# Patient Record
Sex: Male | Born: 1990 | Race: White | Hispanic: No | Marital: Single | Smoking: Current some day smoker
Health system: Southern US, Community
[De-identification: ages and names within clinical notes are randomized; demographics above are authoritative.]

## PROBLEM LIST (undated history)

## (undated) DIAGNOSIS — S42292A Other displaced fracture of upper end of left humerus, initial encounter for closed fracture: Secondary | ICD-10-CM

## (undated) DIAGNOSIS — F909 Attention-deficit hyperactivity disorder, unspecified type: Secondary | ICD-10-CM

## (undated) DIAGNOSIS — F32A Depression, unspecified: Secondary | ICD-10-CM

## (undated) DIAGNOSIS — F329 Major depressive disorder, single episode, unspecified: Secondary | ICD-10-CM

## (undated) DIAGNOSIS — S43005A Unspecified dislocation of left shoulder joint, initial encounter: Secondary | ICD-10-CM

---
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
## 2014-08-19 LAB — CBC
HCT: 52 % (ref 40.0–52.0)
HGB: 17.2 g/dL (ref 13.0–18.0)
MCH: 32.4 pg (ref 26.0–34.0)
MCHC: 33.1 g/dL (ref 32.0–36.0)
MCV: 98 fL (ref 80–100)
PLATELETS: 247 10*3/uL (ref 150–440)
RBC: 5.32 10*6/uL (ref 4.40–5.90)
RDW: 13.7 % (ref 11.5–14.5)
WBC: 9.1 10*3/uL (ref 3.8–10.6)

## 2014-08-19 LAB — COMPREHENSIVE METABOLIC PANEL
AST: 31 U/L (ref 15–37)
Albumin: 5 g/dL (ref 3.4–5.0)
Alkaline Phosphatase: 83 U/L
Anion Gap: 9 (ref 7–16)
BUN: 16 mg/dL (ref 7–18)
Bilirubin,Total: 0.7 mg/dL (ref 0.2–1.0)
CHLORIDE: 100 mmol/L (ref 98–107)
CO2: 28 mmol/L (ref 21–32)
CREATININE: 0.95 mg/dL (ref 0.60–1.30)
Calcium, Total: 9.5 mg/dL (ref 8.5–10.1)
Glucose: 96 mg/dL (ref 65–99)
OSMOLALITY: 275 (ref 275–301)
POTASSIUM: 4.3 mmol/L (ref 3.5–5.1)
SGPT (ALT): 41 U/L
Sodium: 137 mmol/L (ref 136–145)
TOTAL PROTEIN: 8.9 g/dL — AB (ref 6.4–8.2)

## 2014-08-19 LAB — DRUG SCREEN, URINE
AMPHETAMINES, UR SCREEN: NEGATIVE (ref ?–1000)
Barbiturates, Ur Screen: NEGATIVE (ref ?–200)
Benzodiazepine, Ur Scrn: POSITIVE (ref ?–200)
COCAINE METABOLITE, UR ~~LOC~~: NEGATIVE (ref ?–300)
Cannabinoid 50 Ng, Ur ~~LOC~~: POSITIVE (ref ?–50)
MDMA (ECSTASY) UR SCREEN: NEGATIVE (ref ?–500)
Methadone, Ur Screen: NEGATIVE (ref ?–300)
Opiate, Ur Screen: NEGATIVE (ref ?–300)
Phencyclidine (PCP) Ur S: NEGATIVE (ref ?–25)
Tricyclic, Ur Screen: NEGATIVE (ref ?–1000)

## 2014-08-19 LAB — URINALYSIS, COMPLETE
BLOOD: NEGATIVE
Bacteria: NONE SEEN
Bilirubin,UR: NEGATIVE
GLUCOSE, UR: NEGATIVE mg/dL (ref 0–75)
LEUKOCYTE ESTERASE: NEGATIVE
NITRITE: NEGATIVE
Ph: 5 (ref 4.5–8.0)
RBC,UR: NONE SEEN /HPF (ref 0–5)
SPECIFIC GRAVITY: 1.025 (ref 1.003–1.030)
SQUAMOUS EPITHELIAL: NONE SEEN

## 2014-08-19 LAB — SALICYLATE LEVEL: Salicylates, Serum: 2.6 mg/dL

## 2014-08-19 LAB — ETHANOL: Ethanol: <3 mg/dL

## 2014-08-19 LAB — ACETAMINOPHEN LEVEL

## 2014-08-20 ENCOUNTER — Inpatient Hospital Stay: Payer: Self-pay | Admitting: Psychiatry

## 2015-02-05 NOTE — H&P (Signed)
PATIENT NAME:  George [name], George [name] MR#:  [id number] DATE OF BIRTH:  Mar 18, 1991  DATE OF ADMISSION:  08/20/2014  REASON FOR ADMISSION: Patient is a 24 year old man who presented to the emergency room in need of detoxification.   CHIEF COMPLAINT: "I need detox."  HISTORY OF PRESENT ILLNESS: Information obtained from the patient and the chart/consult note. Patient states that he has been very depressed recently and is upset about his drinking and that he has been drinking so heavily. He started drinking at age 83 but it has escalated and he currently drinks about 3 or 4, 40-ounce bottles of beer every day. He denies that he is abusing any other drugs. Mood stays down and depressed. Sleep is erratic. Denies having any hallucinations. In the last week he has had serious thoughts of suicide but did not do anything to actually harm himself. He had a recent breakup with a girlfriend and at the time was intoxicated and had made an attempt to hang himself from the basketball goal by his shirt. He also lost his job in September. These things are all motivating him to stop drinking.  PAST PSYCHIATRIC HISTORY: Patient is currently taking Lexapro 10 mg a day and Strattera 60 mg a day. He has no past history of psychiatric hospitalization. He has only 1 mentioned history of suicide attempt as listed above. Has, also, a distant history of cutting.  SUBSTANCE ABUSE HISTORY: As mentioned, he has been drinking since he was 14. No known history of seizures or DTs. Gets very tremulous and sick when he tries to stop. Has tried to stop outpatient using Librium prescribed by a doctor, but had not been able to stop and instead drinks while taking the librium. Today, patient stated that he feels 1 week of librium is not enough.  SOCIAL HISTORY: Patient is currently not working. Lives with his parents. Has no children of his own.  PAST MEDICAL HISTORY: No significant ongoing medical problems.  CURRENT MEDICATIONS: Lexapro 10  mg a day, Strattera 60 mg a day.  ALLERGIES: No known drug allergies.  REVIEW OF SYSTEMS: Depressed mood, shakiness, negative feelings about himself, low self-esteem. No hallucinations. Mild nausea. Denies any other physical problems. The rest of review of systems is unremarkable.  MENTAL STATUS EXAMINATION: Polite, adequately groomed young man. Looks his stated age. Pleasant and cooperative during the interview. Eye contact good. Psychomotor activity marked by some restlessness and tremor. Speech is normal in rate, tone, and volume. Affect is n nervous. Mood is stated as being nervous or anxious. Thoughts are lucid and clear, with no loosening of association. No signs of delusions. Denies auditory or visual hallucination. Admits to passive suicidal thoughts without plan. No homicidal ideation. Alert nad oriented x 4. He can remember 3 of 3 objects immediately. He is showing adequate judgment and insight at the time; normal intelligence.  FAMILY HISTORY: Alcoholism in multiple members of the family.  LABORATORY RESULTS: Reviewed as listed in chart.  VITAL SIGNS: Updated and reviewed as listed in chart.  ASSESSMENT: This is a 24 year old male with alcohol dependence nad depression, suicide attempted while intoxicated last week, unable to stop using alcohol with outpatient detoxification, tremulous and subjective withdrawal symptoms. Meets criteria for admission.  TREATMENT PLAN: Admit to inpatient psychiatry. Seizure and suicide precautions. Detox orders in place. Will continue. Continue the Lexapro and Strattera. Engaged in groups on the unit and encouraged alcohol cessation.   ____________________________ Kaliann Coryell L. Alvester Morin, MD tlb:ST D: 08/21/2014 23:40:00 ET T: 08/22/2014 00:40:37 ET  JOB#: [id number]  cc: Miquel Stacks L. Alvester MorinBell, MD, <Dictator> Mashonda Broski Rhae LernerL Doris Mcgilvery MD ELECTRONICALLY SIGNED 09/30/2014 2:15

## 2015-02-05 NOTE — Discharge Summary (Signed)
PATIENT NAME:  George [name], George [name] MR#:  [id number] DATE OF BIRTH:  1990-10-24  DATE OF ADMISSION:  08/20/2014 DATE OF DISCHARGE:  08/24/2014  IDENTIFYING INFORMATION: [name] 24 year old Caucasian male, single, presented for alcohol detoxification.   CHIEF COMPLAINT: " I need detox."   DIAGNOSES: Alcohol use disorder, severe. Alcohol withdrawal. Major depressive disorder. Cannabis use disorder, severe. Attention deficit hyperactivity disorder.  HOSPITAL COURSE: The patient presented to the Emergency Department on November 6 after he became intoxicated and made [name] suicidal attempt by hanging from the basketball goal by his shirt. The patient stated that the suicidal attempt was triggered by being intoxicated and also by losing his job at [employer] and losing his girlfriend due to his drinking. The patient was admitted to [facility] for stabilization. He was started on [name] benzodiazepine taper. To prevent medical complications from alcohol withdrawal, he was also started on thiamine and multivitamins. For depressive symptoms, the patient was treated as an outpatient with Lexapro, this medication was continued. The patient also has [name] history of ADHD for which he has been prescribed with Strattera since middle school. The patient was continued on Strattera 60 mg, which is his outpatient dose. The patient did not have any complications from the alcohol detoxification. He tolerated it well, the benzodiazepine taper. He did report [name] strong family history of alcoholism on his mother's side. The patient reported having multiple relatives that had drank themselves to death and abused alcohol on [name]  daily basis. Therefore, it is possible that the patient may respond to treatment with naltrexone; he was started on 50 mg p.o. daily.   The treatment plan recommended to the patient to attend rehabilitation. The patient does have private insurance and the social worker looked into [name] referral for [facility]. The  patient completed the assessment with [facility] by phone; however, after the assessment was completed, the patient stated that he did not feel that rehabilitation was what he needed to do. He felt ready to be discharged and continue his treatment outpatient. The patient stated that some of the reasons why he did not want to attend rehabilitation is because of the financial situation his family is on, and that they did not have enough money for the co-pay. [facility] was willing to work with the patient and even decrease the co-pay to $200 [name] day, but the patient once again declined stating that the family was not able to help him financially with the co-pay. On the day of the discharge, the patient denied any suicidality, homicidality or psychosis. He denied problems with sleep, appetite, energy or concentration. He denied having any physical complaints and denied having any side effects from his medications.   MENTAL STATUS EXAMINATION ON THE DAY OF THE DISCHARGE: The patient was alert and oriented in person, place, time and situation. The patient is [name] 24 year old Caucasian male who appears his stated age. He displays good hygiene and grooming. Behavior: He was calm, pleasant, cooperative. Psychomotor activity was within normal range at contact was within normal range. Speech had regular tone, volume and rate. Thought process is linear and goal directed. Thought content was negative for suicidality or homicidality. Perception was negative for psychosis. Mood was euthymic. Affect reactive. Insight and judgment are fair. Cognitive examination: Alert and oriented in person, place, time and situation. Fund of knowledge appears to be average; however, it was not formally tested. Attention and concentration appear to be grossly intact; however, it was not formally tested.   LABORATORY RESULTS: BUN 16,  creatinine 0.95, sodium 137, potassium 4.3, calcium 9.5. Alcohol level at arrival was less than 3. AST 31, ALT 41. Urine  toxicology screen was positive for benzodiazepines and positive for cannabis, as the patient was taking Librium prescribed by a PA that he was seeing on the outpatient basis and the patient did report using cannabis occasionally. WBC 9.1, hemoglobin 17.2, hematocrit 52.0, platelet count 247,000. UA was clear. Acetaminophen level and salicylate level were below detection limit.   DISCHARGE FOLLOWUP: The patient will return to his house with his parents where he lives with his parents. For followup, the patient has been referred to the [facility] in [place], [place]. He has an appointment there at 5:00 p.m. on November 12, telephone number is [phone], fax number is [phone].   DISCHARGE MEDICATIONS: Lexapro 20 mg p.o. daily for depression, Strattera 60 mg p.o. daily for ADHD and naltrexone 50 mg p.o. daily for alcohol dependence.    ____________________________ Jimmy Footman, MD ahg:TT D: 08/24/2014 17:10:28 ET T: 08/24/2014 19:57:00 ET JOB#: [id number]  cc: Jimmy Footman, MD, <Dictator> Horton Chin MD ELECTRONICALLY SIGNED 08/25/2014 14:45

## 2015-02-05 NOTE — Consult Note (Signed)
PATIENT NAME:  George [name], George [name] MR#:  [id number] DATE OF BIRTH:  05/20/1991  DATE OF CONSULTATION:  08/20/2014  REFERRING PHYSICIAN:   CONSULTING PHYSICIAN:  Audery AmelJohn T. Clapacs, MD  IDENTIFYING INFORMATION AND REASON FOR CONSULTATION: [name] 24 year old man presented to the Emergency Room.   CHIEF COMPLAINT: "I need detox."   HISTORY OF PRESENT ILLNESS: Information obtained from the patient and the chart. The patient states that he has been very depressed recently, upset about his drinking and has been drinking heavily. He started drinking at age 24, but it has escalated and he currently drinks about 3 or 4 forty-ounce bottles of beer every day. He denies that he is abusing any other drugs. His mood stays down and depressed. His sleep is erratic. He is not having any hallucinations. In the last week, he had some serious thoughts of suicide, but did not actually do anything to harm himself. He had [name] recent break-up with [name] girlfriend and at the time he was intoxicated, and had made an attempt to hang himself from basketball goal by his shirt. He also lost his job in September. These are all motivating him to stop.   PAST PSYCHIATRIC HISTORY: The patient is currently taking Lexapro 10 mg [name] day and Strattera 60 mg [name] day. He has no past history of psychiatric hospitalization. He has only the one mentioned history of suicide attempts, but he has been more distant history of cutting.   SUBSTANCE ABUSE HISTORY: As mentioned, he has been drinking since he was 14. No known history of seizures or DTs. Gets very tremulous and sick when he tries to stop. Has tried to stop outpatient using Librium prescribed by [name] doctor, but has never been able to stop, and instead just drinks while taking the Librium.   SOCIAL HISTORY: The patient is currently not working. He lives with his parents. Has no children of his own.   PAST MEDICAL HISTORY: No significant ongoing medical problems.   CURRENT MEDICATIONS: Lexapro 10 mg [name]  day, Strattera 60 mg [name] day.   ALLERGIES: No known drug allergies.   REVIEW OF SYSTEMS: Depressed mood. Shakiness. Mild nausea. Negative feelings about himself. No hallucinations. The rest of the physical review of systems is unremarkable.   MENTAL STATUS EXAMINATION: Adequately groomed young man who looks his stated age, cooperative and pleasant during the interview. Eye contact good. Psychomotor activity marked by [name] mild rest and action tremor. Speech is normal in rate, tone and volume. Affect is nervous and jittery. Mood stated as being nervous. Thoughts are lucid without loosening of associations. No sign of delusions. Denies auditory or visual hallucinations. Passive suicidal thoughts without plan. No homicidal ideation. Alert and oriented x4. He can remember 3/3 objects immediately and 2/3 at 3 minutes. He is showing adequate judgment and insight at the moment. Normal intelligence.   FAMILY HISTORY: Family history of alcoholism in multiple members of the family.   LABORATORY RESULTS: Chemistry panel: Elevated total protein 8.9, otherwise unremarkable. Drug screen is positive for benzodiazepines and cannabis; I am not sure whether that was done after he was given medication. The alcohol level was undetectable. CBC normal. Urinalysis: Positive protein. No infection. Acetaminophen and salicylates negative.   VITAL SIGNS: In the Emergency Room: Blood pressure 127/77, respirations 18, pulse 94, temperature 98.2.   ASSESSMENT: [name] 24 year old male with alcohol dependence and depression. Suicide attempt while intoxicated last week. Unable to stop using outpatient detoxification. Tremulous and  subjective withdrawal symptoms. Meets criteria for admission to  the hospital for detoxification as well as stabilization of depression.   TREATMENT PLAN: Admit to psychiatry. Seizure and suicide precautions. Detox orders in place. Continue the Lexapro and Strattera. Engage him in groups on the unit and further  treatment can be decided by the primary team.   DIAGNOSIS, PRINCIPAL AND PRIMARY:  AXIS I: Alcohol dependence.   SECONDARY DIAGNOSES: AXIS I: Major depression, single, moderate.  AXIS II: Deferred.  AXIS III: Alcohol withdrawal.   ____________________________ Audery Amel, MD jtc:lm D: 08/20/2014 20:57:53 ET T: 08/20/2014 21:11:06 ET JOB#: [id number]  cc: Audery Amel, MD, <Dictator> Audery Amel MD ELECTRONICALLY SIGNED 08/21/2014 14:59

## 2015-05-17 ENCOUNTER — Emergency Department (HOSPITAL_COMMUNITY): Payer: BLUE CROSS/BLUE SHIELD

## 2015-05-17 ENCOUNTER — Encounter (HOSPITAL_COMMUNITY): Payer: Self-pay | Admitting: Cardiology

## 2015-05-17 ENCOUNTER — Emergency Department (HOSPITAL_COMMUNITY)
Admission: EM | Admit: 2015-05-17 | Discharge: 2015-05-17 | Disposition: A | Discharge status: Home / Self Care | Payer: BLUE CROSS/BLUE SHIELD | Attending: Emergency Medicine | Admitting: Emergency Medicine

## 2015-05-17 DIAGNOSIS — F419 Anxiety disorder, unspecified: Secondary | ICD-10-CM | POA: Insufficient documentation

## 2015-05-17 DIAGNOSIS — Y998 Other external cause status: Secondary | ICD-10-CM | POA: Diagnosis not present

## 2015-05-17 DIAGNOSIS — S43005A Unspecified dislocation of left shoulder joint, initial encounter: Secondary | ICD-10-CM

## 2015-05-17 DIAGNOSIS — Z720 Tobacco use: Secondary | ICD-10-CM | POA: Insufficient documentation

## 2015-05-17 DIAGNOSIS — X58XXXA Exposure to other specified factors, initial encounter: Secondary | ICD-10-CM | POA: Insufficient documentation

## 2015-05-17 DIAGNOSIS — Y9289 Other specified places as the place of occurrence of the external cause: Secondary | ICD-10-CM | POA: Insufficient documentation

## 2015-05-17 DIAGNOSIS — F909 Attention-deficit hyperactivity disorder, unspecified type: Secondary | ICD-10-CM | POA: Diagnosis not present

## 2015-05-17 DIAGNOSIS — S43015A Anterior dislocation of left humerus, initial encounter: Secondary | ICD-10-CM | POA: Diagnosis not present

## 2015-05-17 DIAGNOSIS — Y9389 Activity, other specified: Secondary | ICD-10-CM | POA: Insufficient documentation

## 2015-05-17 DIAGNOSIS — S4992XA Unspecified injury of left shoulder and upper arm, initial encounter: Secondary | ICD-10-CM | POA: Diagnosis present

## 2015-05-17 DIAGNOSIS — Z79899 Other long term (current) drug therapy: Secondary | ICD-10-CM | POA: Diagnosis not present

## 2015-05-17 DIAGNOSIS — F329 Major depressive disorder, single episode, unspecified: Secondary | ICD-10-CM | POA: Diagnosis not present

## 2015-05-17 DIAGNOSIS — S42292A Other displaced fracture of upper end of left humerus, initial encounter for closed fracture: Secondary | ICD-10-CM | POA: Insufficient documentation

## 2015-05-17 HISTORY — DX: Depression, unspecified: F32.A

## 2015-05-17 HISTORY — DX: Major depressive disorder, single episode, unspecified: F32.9

## 2015-05-17 HISTORY — DX: Attention-deficit hyperactivity disorder, unspecified type: F90.9

## 2015-05-17 LAB — BASIC METABOLIC PANEL
Anion gap: 15 (ref 5–15)
BUN: 18 mg/dL (ref 6–20)
CO2: 22 mmol/L (ref 22–32)
Calcium: 11.3 mg/dL — ABNORMAL HIGH (ref 8.9–10.3)
Chloride: 99 mmol/L — ABNORMAL LOW (ref 101–111)
Creatinine, Ser: 1.15 mg/dL (ref 0.61–1.24)
GFR calc Af Amer: >60 mL/min (ref >60)
GFR calc non Af Amer: >60 mL/min (ref >60)
Glucose, Bld: 117 mg/dL — ABNORMAL HIGH (ref 65–99)
Potassium: 3.9 mmol/L (ref 3.5–5.1)
Sodium: 136 mmol/L (ref 135–145)

## 2015-05-17 LAB — CBC
HCT: 47.5 % (ref 39.0–52.0)
Hemoglobin: 16.7 g/dL (ref 13.0–17.0)
MCH: 32.7 pg (ref 26.0–34.0)
MCHC: 35.2 g/dL (ref 30.0–36.0)
MCV: 93.1 fL (ref 78.0–100.0)
Platelets: 276 10*3/uL (ref 150–400)
RBC: 5.1 MIL/uL (ref 4.22–5.81)
RDW: 12.9 % (ref 11.5–15.5)
WBC: 25.4 10*3/uL — ABNORMAL HIGH (ref 4.0–10.5)

## 2015-05-17 MED ORDER — HYDROCODONE-ACETAMINOPHEN 5-325 MG PO TABS: 1.0000 | ORAL_TABLET | ORAL | Status: DC | PRN

## 2015-05-17 MED ORDER — HYDROMORPHONE HCL 1 MG/ML IJ SOLN
1.0000 mg | Freq: Once | INTRAMUSCULAR | Status: AC
  Administered 2015-05-17: 1 mg via INTRAVENOUS
  Filled 2015-05-17: qty 1

## 2015-05-17 MED ORDER — PROPOFOL 10 MG/ML IV BOLUS
INTRAVENOUS | Status: AC
  Administered 2015-05-17: 60 mg via INTRAVENOUS
  Filled 2015-05-17: qty 20

## 2015-05-17 MED ORDER — MORPHINE SULFATE 4 MG/ML IJ SOLN
6.0000 mg | Freq: Once | INTRAMUSCULAR | Status: AC
  Administered 2015-05-17: 6 mg via INTRAVENOUS
  Filled 2015-05-17: qty 2

## 2015-05-17 MED ORDER — SODIUM CHLORIDE 0.9 % IV BOLUS (SEPSIS)
1000.0000 mL | Freq: Once | INTRAVENOUS | Status: AC
Start: 2015-05-17 — End: 2015-05-17
  Administered 2015-05-17: 1000 mL via INTRAVENOUS

## 2015-05-17 MED ORDER — PROPOFOL 10 MG/ML IV BOLUS
60.0000 mg | Freq: Once | INTRAVENOUS | Status: AC
  Administered 2015-05-17 (×2): 60 mg via INTRAVENOUS

## 2015-05-17 NOTE — Discharge Instructions (Signed)
If you were given medicines take as directed.  If you are on coumadin or contraceptives realize their levels and effectiveness is altered by many different medicines.  If you have any reaction (rash, tongues swelling, other) to the medicines stop taking and see a physician.   No weight bearing, wear sling. If your blood pressure was elevated in the ER make sure you follow up for management with a primary doctor or return for chest pain, shortness of breath or stroke symptoms. For severe pain take norco or vicodin however realize they have the potential for addiction and it can make you sleepy and has tylenol in it.  No operating machinery while taking.  Please follow up as directed and return to the ER or see a physician for new or worsening symptoms.  Thank you. Filed Vitals:   05/17/15 1500 05/17/15 1530 05/17/15 1612 05/17/15 1616  BP: 141/94 132/108 142/111 151/97  Pulse: 120 121 127 122  Resp: Height:      Weight:      SpO2: 97% 98% 99% 100%

## 2015-05-17 NOTE — ED Provider Notes (Addendum)
CSN: [id number]     Arrival date & time 05/17/15  1346 History   First MD Initiated Contact with Patient 05/17/15 1355     Chief Complaint  Patient presents with  . Shoulder Injury     (Consider location/radiation/quality/duration/timing/severity/associated sxs/prior Treatment) HPI Comments: 24 year old male with history of depression ADHD presents with bilateral shoulder pain worse the left side. Patient was in court today and does not remember details except he recalls being anxious and frustrated about having to give a urine drug screen. The next thing he knew he was surrounded by officers in handcuffs. He is unsure if he passed out. No cardiac history. Patient has tenderness with any range of motion. Patient felt that both the shoulders were dislocated however he felt the right shoulder popping. Patient primarily has pain in the left shoulder with range of motion. Patient had x-rays done showing dislocation fracture at urgent care and sent here. Orthopedics was called prior to arrival to ensure they were on call to assist. No significant meals today.  Patient is a 24 y.o. male presenting with shoulder injury. The history is provided by the patient.  Shoulder Injury Pertinent negatives include no chest pain, no abdominal pain, no headaches and no shortness of breath.    Past Medical History  Diagnosis Date  . Depressed   . ADHD (attention deficit hyperactivity disorder)    History reviewed. No pertinent past surgical history. History reviewed. No pertinent family history. History  Substance Use Topics  . Smoking status: Current Every Day Smoker  . Smokeless tobacco: Not on file  . Alcohol Use: No    Review of Systems  Constitutional: Negative for fever and chills.  HENT: Negative for congestion.   Eyes: Negative for visual disturbance.  Respiratory: Negative for shortness of breath.   Cardiovascular: Negative for chest pain.  Gastrointestinal: Negative for vomiting and  abdominal pain.  Genitourinary: Negative for dysuria and flank pain.  Musculoskeletal: Positive for arthralgias. Negative for back pain, neck pain and neck stiffness.  Skin: Negative for rash.  Neurological: Negative for light-headedness and headaches.      Allergies  Review of patient's allergies indicates no known allergies.  Home Medications   Prior to Admission medications   Medication Sig Start Date End Date Taking? Authorizing Provider  escitalopram (LEXAPRO) 20 MG tablet Take 20 mg by mouth daily. 04/14/15  Yes Historical Provider, MD  ibuprofen (ADVIL,MOTRIN) 200 MG tablet Take 200 mg by mouth every 6 (six) hours as needed for mild pain or moderate pain.   Yes Historical Provider, MD  STRATTERA 80 MG capsule Take 80 mg by mouth daily. 04/27/15  Yes Historical Provider, MD  HYDROcodone-acetaminophen (NORCO) 5-325 MG per tablet Take 1-2 tablets by mouth every 4 (four) hours as needed. 05/17/15   Blane Ohara, MD   BP 130/102 mmHg  Pulse 107  Temp(Src) 98.1 F (36.7 C) (Oral)  Resp 16  Ht 6' (1.829 m)  Wt 220 lb (99.791 kg)  BMI 29.83 kg/m2  SpO2 100% Physical Exam  Constitutional: He is oriented to person, place, and time. He appears well-developed and well-nourished.  HENT:  Head: Normocephalic and atraumatic.  Eyes: Right eye exhibits no discharge. Left eye exhibits no discharge.  Neck: Normal range of motion. Neck supple. No tracheal deviation present.  Cardiovascular: Normal rate and regular rhythm.   Pulmonary/Chest: Effort normal and breath sounds normal.  Abdominal: Soft. He exhibits no distension. There is no tenderness. There is no guarding.  Musculoskeletal: He exhibits tenderness.  He exhibits no edema.  Patient has moderate tenderness to palpation anterior lateral shoulder joint on the left. No significant effusion. Deformity and protrusion anterior mild. Neurovascularly intact distal left arm, pain with any range of motion held internal rotation. Patient has  full range of motion right shoulder with minimal discomfort.  Neurological: He is alert and oriented to person, place, and time. No cranial nerve deficit.  Skin: Skin is warm. No rash noted.  Psychiatric: He has a normal mood and affect.  Nursing note and vitals reviewed.   ED Course  Reduction of dislocation Date/Time: 05/17/2015 4:18 PM Performed by: Blane Ohara Authorized by: Blane Ohara Consent: Verbal consent obtained. Written consent obtained. Consent given by: patient Patient identity confirmed: verbally with patient and arm band Time out: Immediately prior to procedure a "time out" was called to verify the correct patient, procedure, equipment, support staff and site/side marked as required. Patient sedated: yes Sedatives: propofol Vitals: Vital signs were monitored during sedation. Patient tolerance: Patient tolerated the procedure well with no immediate complications   (including critical care time) Procedural sedation Performed by: Enid Skeens Consent: Verbal consent obtained. Risks and benefits: risks, benefits and alternatives were discussed Required items: required blood products, implants, devices, and special equipment available Patient identity confirmed: arm band and provided demographic data Time out: Immediately prior to procedure a "time out" was called to verify the correct patient, procedure, equipment, support staff and site  Sedation type: moderate (conscious) sedation NPO time confirmed, risks discussed  Sedatives: propofol  Physician Time at Bedside: 15 min  Vitals: Vital signs were monitored during sedation. Cardiac Monitor, pulse oximeter Patient tolerance: Patient tolerated the procedure well with no immediate complications. Comments: Pt with uneventful recovered. Returned to pre-procedural sedation baseline    Labs Review Labs Reviewed  CBC - Abnormal; Notable for the following:    WBC 25.4 (*)    All other components within  normal limits  BASIC METABOLIC PANEL - Abnormal; Notable for the following:    Chloride 99 (*)    Glucose, Bld 117 (*)    Calcium 11.3 (*)    All other components within normal limits    Imaging Review Dg Shoulder Right  05/17/2015   CLINICAL DATA:  Syncopal episode today. Fall. Right shoulder pain. Dislocation.  EXAM: RIGHT SHOULDER - 2+ VIEW  COMPARISON:  None.  FINDINGS: Right shoulder is located. No acute bone or soft tissue abnormalities are present. The visualized hemi thorax is clear. The right clavicle is intact.  IMPRESSION: Negative right shoulder radiographs.   Electronically Signed   By: Marin Roberts M.D.   On: 05/17/2015 15:01   Dg Shoulder Left  05/17/2015   CLINICAL DATA:  Pain following fall  EXAM: LEFT SHOULDER - 2+ VIEW  COMPARISON:  None.  FINDINGS: Frontal, Y scapular, and attempted axillary images were obtained. There is a subcoracoid anterior dislocation. There is a comminuted fracture involving the greater tuberosity with multiple displaced fracture fragments in this area. The greater tuberosity is displaced lateral to the remainder of the humerus. No other fracture. No erosive change.  IMPRESSION: Subcoracoid anterior dislocation. Comminuted fracture along the proximal left humerus with the greater tuberosity avulsed and fragmented.   Electronically Signed   By: Bretta Bang III M.D.   On: 05/17/2015 15:01   Dg Shoulder Left Port  05/17/2015   CLINICAL DATA:  Post reduction left shoulder  EXAM: LEFT SHOULDER - 1 VIEW  COMPARISON:  Left shoulder earlier today  FINDINGS: Satisfactory  reduction.  Avulsion fracture of the greater tuberosity with improved alignment. AC joint intact.  IMPRESSION: Satisfactory reduction of left shoulder. Improved alignment of greater tuberosity fracture.   Electronically Signed   By: Marlan Palau M.D.   On: 05/17/2015 16:40     EKG Interpretation None     EKG reviewed, HR 116 sinus tach, nl QT, no other acute findings.  MDM    Final diagnoses:  Shoulder dislocation, left, initial encounter  Humeral head fracture, left, closed, initial encounter   Patient presents with left shoulder pain and possible syncope/seizure difficult as he does not recall the events and no one at bedside.  X-ray reviewed consistent with dislocation and fracture of humeral head on the left. Orthopedics consult at. Pain medicines ordered and basic blood work. EKG reviewed sinus tachycardia, no delta waves normal PR. Pain meds given.  Repaged ortho. Dr Romeo Apple rec to reduce and if unable to call him back.  Discussed r/b with pt, time out, shoulder reduced one attempt, repeat xray.  Fup in office.    The patients results and plan were reviewed and discussed.   Any x-rays performed were independently reviewed by myself.   Differential diagnosis were considered with the presenting HPI.  Medications  sodium chloride 0.9 % bolus 1,000 mL (0 mLs Intravenous Stopped 05/17/15 1526)  morphine 4 MG/ML injection 6 mg (6 mg Intravenous Given 05/17/15 1428)  HYDROmorphone (DILAUDID) injection 1 mg (1 mg Intravenous Given 05/17/15 1530)  propofol (DIPRIVAN) 10 mg/mL bolus/IV push 60 mg (0 mg Intravenous Stopped 05/17/15 1648)    Filed Vitals:   05/17/15 1621 05/17/15 1625 05/17/15 1625 05/17/15 1743  BP: 132/98 130/94 130/94 130/102  Pulse: 123 113 112 107  Temp:    98.1 F (36.7 C)  TempSrc:    Oral  Resp: 27 25 20 16   Height:      Weight:      SpO2: 99% 99% 96% 100%    Final diagnoses:  Shoulder dislocation, left, initial encounter  Humeral head fracture, left, closed, initial encounter       Blane Ohara, MD 05/17/15 1542  Blane Ohara, MD 05/18/15 [phone]

## 2015-05-17 NOTE — ED Notes (Signed)
Detained by an officer this morning and injured left shoulder.  Went to urgent care and shoulder broken and dislocated.

## 2015-05-19 ENCOUNTER — Encounter (HOSPITAL_COMMUNITY): Payer: Self-pay | Admitting: Emergency Medicine

## 2015-05-19 ENCOUNTER — Emergency Department (HOSPITAL_COMMUNITY)
Admission: EM | Admit: 2015-05-19 | Discharge: 2015-05-19 | Disposition: A | Discharge status: Home / Self Care | Payer: BLUE CROSS/BLUE SHIELD | Attending: Emergency Medicine | Admitting: Emergency Medicine

## 2015-05-19 ENCOUNTER — Ambulatory Visit: Payer: BLUE CROSS/BLUE SHIELD | Admitting: Orthopedic Surgery

## 2015-05-19 DIAGNOSIS — M79661 Pain in right lower leg: Secondary | ICD-10-CM | POA: Diagnosis not present

## 2015-05-19 DIAGNOSIS — M2550 Pain in unspecified joint: Secondary | ICD-10-CM

## 2015-05-19 DIAGNOSIS — Z79899 Other long term (current) drug therapy: Secondary | ICD-10-CM | POA: Diagnosis not present

## 2015-05-19 DIAGNOSIS — M549 Dorsalgia, unspecified: Secondary | ICD-10-CM | POA: Diagnosis not present

## 2015-05-19 DIAGNOSIS — S42302D Unspecified fracture of shaft of humerus, left arm, subsequent encounter for fracture with routine healing: Secondary | ICD-10-CM | POA: Diagnosis not present

## 2015-05-19 DIAGNOSIS — M25512 Pain in left shoulder: Secondary | ICD-10-CM | POA: Diagnosis present

## 2015-05-19 DIAGNOSIS — F909 Attention-deficit hyperactivity disorder, unspecified type: Secondary | ICD-10-CM | POA: Insufficient documentation

## 2015-05-19 DIAGNOSIS — M25511 Pain in right shoulder: Secondary | ICD-10-CM | POA: Diagnosis not present

## 2015-05-19 DIAGNOSIS — J029 Acute pharyngitis, unspecified: Secondary | ICD-10-CM | POA: Diagnosis not present

## 2015-05-19 DIAGNOSIS — W1839XD Other fall on same level, subsequent encounter: Secondary | ICD-10-CM | POA: Diagnosis not present

## 2015-05-19 DIAGNOSIS — M79662 Pain in left lower leg: Secondary | ICD-10-CM | POA: Diagnosis not present

## 2015-05-19 DIAGNOSIS — F329 Major depressive disorder, single episode, unspecified: Secondary | ICD-10-CM | POA: Diagnosis not present

## 2015-05-19 DIAGNOSIS — Z720 Tobacco use: Secondary | ICD-10-CM | POA: Insufficient documentation

## 2015-05-19 DIAGNOSIS — R112 Nausea with vomiting, unspecified: Secondary | ICD-10-CM | POA: Diagnosis not present

## 2015-05-19 HISTORY — DX: Unspecified dislocation of left shoulder joint, initial encounter: S43.005A

## 2015-05-19 HISTORY — DX: Other displaced fracture of upper end of left humerus, initial encounter for closed fracture: S42.292A

## 2015-05-19 MED ORDER — HYDROCODONE-ACETAMINOPHEN 5-325 MG PO TABS: 1.0000 | ORAL_TABLET | Freq: Four times a day (QID) | ORAL | Status: DC | PRN

## 2015-05-19 MED ORDER — ONDANSETRON HCL 4 MG/2ML IJ SOLN: 4.0000 mg | Freq: Once | INTRAMUSCULAR | Status: DC

## 2015-05-19 MED ORDER — HYDROMORPHONE HCL 1 MG/ML IJ SOLN
1.0000 mg | Freq: Once | INTRAMUSCULAR | Status: AC
  Administered 2015-05-19: 1 mg via INTRAVENOUS
  Filled 2015-05-19: qty 1

## 2015-05-19 MED ORDER — SODIUM CHLORIDE 0.9 % IV SOLN
INTRAVENOUS | Status: DC
  Administered 2015-05-19: 12:00:00 via INTRAVENOUS

## 2015-05-19 MED ORDER — ONDANSETRON HCL 4 MG/2ML IJ SOLN
4.0000 mg | Freq: Once | INTRAMUSCULAR | Status: AC
  Administered 2015-05-19: 4 mg via INTRAVENOUS
  Filled 2015-05-19: qty 2

## 2015-05-19 MED ORDER — HYDROMORPHONE HCL 4 MG PO TABS: 4.0000 mg | ORAL_TABLET | Freq: Four times a day (QID) | ORAL | Status: DC | PRN

## 2015-05-19 MED ORDER — KETOROLAC TROMETHAMINE 30 MG/ML IJ SOLN
30.0000 mg | Freq: Once | INTRAMUSCULAR | Status: AC
  Administered 2015-05-19: 30 mg via INTRAVENOUS
  Filled 2015-05-19: qty 1

## 2015-05-19 MED ORDER — SODIUM CHLORIDE 0.9 % IV BOLUS (SEPSIS)
1000.0000 mL | Freq: Once | INTRAVENOUS | Status: AC
  Administered 2015-05-19: 1000 mL via INTRAVENOUS

## 2015-05-19 NOTE — ED Notes (Signed)
Pt given water to drink with MD Zackowski approval.

## 2015-05-19 NOTE — ED Notes (Signed)
Pt instructed to take slow, deep breaths to control rapid RR. Pt verbalized understanding.

## 2015-05-19 NOTE — ED Notes (Signed)
Pt c/o of worsening pain. Pt requesting medication for pain. Reminded patient that MD would have to assess pt before pain medication could be given. Pt verbalized understanding. Pt states nausea has improved since Zofran administration.

## 2015-05-19 NOTE — ED Notes (Addendum)
Pt sent from MD Harrison's office for uncontrolled pain and vomiting. Pt also c/o of worsening pain to LT shoulder and c/o of bilateral knee pain. Pt dry heaving upon assessment. Pt able to ambulate with assistance. Pt had a LT shoulder reduction yesterday. Pt also has a LT humeral head fracture.

## 2015-05-19 NOTE — ED Notes (Signed)
MD Zackowski at bedside. 

## 2015-05-19 NOTE — Discharge Instructions (Signed)
Keep your left arm in a sling make appointment to follow-up with Dr. Romeo Apple. Take pain medications as directed.

## 2015-05-19 NOTE — ED Provider Notes (Signed)
CSN: [id number]     Arrival date & time 05/19/15  0929 History  This chart was scribed for Vanetta Mulders, MD by Andrew Au, ED Scribe. This patient was seen in room APA02/APA02 and the patient's care was started at 11:19 AM.  Chief Complaint  Patient presents with  . Emesis   HPI   HPI Comments:   George [name] is a 24 y.o. male who present to the Emergency Department complaining of uncontrolled pain and emesis. Pt was seen here 2 days ago. He had a dislocation of left shoulder that was reduced and was prescribed hydrocodone. He was referred to ortho, Dr. Romeo Apple, for follow up. Pt went to see Dr. Romeo Apple this morning but states Dr. Romeo Apple would not see him due to him being in distressed. He currently has 9/10 bilateral knee pain, 8/10 left shoulder pain and right shoulder pain with locking of right shoulder that he states began 2 days ago after being seen but worsened today. He also reports emesis, some sore throat, back pain and arthralgia.  Pt has been taking hydrocodone and reports he finished medication this morning. No hx of seizure. He denies fevers, chills, visual changes, cough rhinorrhea, CP, trouble breathing, abdominal pain, diarrhea, dysuria, leg swelling, rash, bleeding easily, and HA.  Past Medical History  Diagnosis Date  . Depressed   . ADHD (attention deficit hyperactivity disorder)   . Dislocation of left shoulder joint   . Fracture of humeral head, left, closed    History reviewed. No pertinent past surgical history. No family history on file. History  Substance Use Topics  . Smoking status: Current Every Day Smoker  . Smokeless tobacco: Not on file  . Alcohol Use: No    Review of Systems  Constitutional: Negative for fever and chills.  HENT: Positive for sore throat. Negative for congestion and rhinorrhea.   Eyes: Negative for visual disturbance.  Respiratory: Negative for apnea, cough and shortness of breath.   Cardiovascular: Negative for chest pain  and leg swelling.  Gastrointestinal: Positive for nausea and vomiting. Negative for abdominal pain and diarrhea.  Genitourinary: Negative for dysuria.  Musculoskeletal: Positive for myalgias, back pain and arthralgias.  Skin: Negative for rash.  Neurological: Negative for headaches.  Hematological: Does not bruise/bleed easily.   Allergies  Review of patient's allergies indicates no known allergies.  Home Medications   Prior to Admission medications   Medication Sig Start Date End Date Taking? Authorizing Provider  escitalopram (LEXAPRO) 20 MG tablet Take 20 mg by mouth daily. 04/14/15  Yes Historical Provider, MD  HYDROcodone-acetaminophen (NORCO) 5-325 MG per tablet Take 1-2 tablets by mouth every 4 (four) hours as needed. 05/17/15  Yes Blane Ohara, MD  ibuprofen (ADVIL,MOTRIN) 200 MG tablet Take 600 mg by mouth every 6 (six) hours as needed for mild pain or moderate pain.    Yes Historical Provider, MD  STRATTERA 80 MG capsule Take 80 mg by mouth daily. 04/27/15  Yes Historical Provider, MD  HYDROcodone-acetaminophen (NORCO/VICODIN) 5-325 MG per tablet Take 1-2 tablets by mouth every 6 (six) hours as needed. 05/19/15   Vanetta Mulders, MD  HYDROmorphone (DILAUDID) 4 MG tablet Take 1 tablet (4 mg total) by mouth every 6 (six) hours as needed for severe pain. 05/19/15   Vanetta Mulders, MD   BP 134/80 mmHg  Pulse 105  Temp(Src) 100.5 F (38.1 C)  Resp 18  Ht 6' (1.829 m)  Wt 220 lb (99.791 kg)  BMI 29.83 kg/m2  SpO2 91% Physical Exam  Constitutional: He is oriented to person, place, and time. He appears well-developed and well-nourished. No distress.  HENT:  Head: Normocephalic and atraumatic.  Eyes: Conjunctivae and EOM are normal.  Neck: Neck supple.  Cardiovascular: Normal rate and regular rhythm.   Pulses:      Radial pulses are 2+ on the right side, and 2+ on the left side.     Pulmonary/Chest: Effort normal and breath sounds normal. He has no wheezes. He has no rales.   Abdominal: Bowel sounds are normal. There is no tenderness.  Musculoskeletal: Normal range of motion.  No swelling in ankle bilaterally.No deformity in left shoulder. No effusion in knee bilaterally . No swelling in knee  Discomfort in right elbow and right shoulder.  Neurological: He is alert and oriented to person, place, and time.  Skin: Skin is warm and dry.  Psychiatric: He has a normal mood and affect. His behavior is normal.  Nursing note and vitals reviewed.  ED Course  Procedures (including critical care time) DIAGNOSTIC STUDIES: Oxygen Saturation is 91% on RA, adequate by my interpretation.    COORDINATION OF CARE: 11:36 AM- Pt advised of plan for treatment and pt agrees.  Labs Review Labs Reviewed - No data to display  Imaging Review Dg Shoulder Right  05/17/2015   CLINICAL DATA:  Syncopal episode today. Fall. Right shoulder pain. Dislocation.  EXAM: RIGHT SHOULDER - 2+ VIEW  COMPARISON:  None.  FINDINGS: Right shoulder is located. No acute bone or soft tissue abnormalities are present. The visualized hemi thorax is clear. The right clavicle is intact.  IMPRESSION: Negative right shoulder radiographs.   Electronically Signed   By: Marin Roberts M.D.   On: 05/17/2015 15:01   Dg Shoulder Left  05/17/2015   CLINICAL DATA:  Pain following fall  EXAM: LEFT SHOULDER - 2+ VIEW  COMPARISON:  None.  FINDINGS: Frontal, Y scapular, and attempted axillary images were obtained. There is a subcoracoid anterior dislocation. There is a comminuted fracture involving the greater tuberosity with multiple displaced fracture fragments in this area. The greater tuberosity is displaced lateral to the remainder of the humerus. No other fracture. No erosive change.  IMPRESSION: Subcoracoid anterior dislocation. Comminuted fracture along the proximal left humerus with the greater tuberosity avulsed and fragmented.   Electronically Signed   By: Bretta Bang III M.D.   On: 05/17/2015 15:01   Dg  Shoulder Left Port  05/17/2015   CLINICAL DATA:  Post reduction left shoulder  EXAM: LEFT SHOULDER - 1 VIEW  COMPARISON:  Left shoulder earlier today  FINDINGS: Satisfactory reduction.  Avulsion fracture of the greater tuberosity with improved alignment. AC joint intact.  IMPRESSION: Satisfactory reduction of left shoulder. Improved alignment of greater tuberosity fracture.   Electronically Signed   By: Marlan Palau M.D.   On: 05/17/2015 16:40     EKG Interpretation None      MDM   Final diagnoses:  Arthralgia  Humerus fracture, left, with routine healing, subsequent encounter    Patient's joint pain to both knees and both shoulders improved significantly with pain medication in the emergency department. No new falls or injuries. Left arms never been out of the sling. Areas were not re-x-rayed. Patient's pain medication modified. Patient will make an another appointment to follow-up with orthopedics. Patient will keep her left arm in sling.  I personally performed the services described in this documentation, which was scribed in my presence. The recorded information has been reviewed and is accurate.  Vanetta Mulders, MD 05/19/15 1343

## 2015-05-23 ENCOUNTER — Ambulatory Visit (INDEPENDENT_AMBULATORY_CARE_PROVIDER_SITE_OTHER): Payer: BLUE CROSS/BLUE SHIELD | Admitting: Orthopedic Surgery

## 2015-05-23 VITALS — BP 146/98 | Ht 72.0 in | Wt 220.0 lb

## 2015-05-23 DIAGNOSIS — S4292XA Fracture of left shoulder girdle, part unspecified, initial encounter for closed fracture: Secondary | ICD-10-CM

## 2015-05-23 MED ORDER — HYDROMORPHONE HCL 4 MG PO TABS: 4.0000 mg | ORAL_TABLET | ORAL | Status: DC | PRN

## 2015-05-23 NOTE — Patient Instructions (Signed)
We will schedule Ct scan and have you follow up in the office afterward

## 2015-05-24 ENCOUNTER — Encounter: Payer: Self-pay | Admitting: Orthopedic Surgery

## 2015-05-24 NOTE — Progress Notes (Signed)
Patient ID: George [name], male   DOB: 09-20-1991, 24 y.o.   MRN: [id number]  NEW  Chief Complaint  Patient presents with  . Shoulder Injury    er follow up left shoulder fx + dislocation , DOI 05/17/15    George [name] is a 24 y.o. male.   HPI 24 year old male [place] has a history of ADHD he sustained a fracture dislocation of his left shoulder documented by x-ray. He also says that his right shoulder also came out of place but spontaneously reduced and required no treatment. He was injured on 05/17/2015 brought to the ER x-ray showed a fracture dislocation of the left shoulder involving the left greater tuberosity and glenohumeral joint. He underwent closed reduction with good glenohumeral joint reduction on x-ray but possible continued displacement of the greater tuberosity which will require further imaging.  He comes in complaining of pain and swelling over the left shoulder 6 days associated with moderate to severe pain. He is on Norco and Dilaudid along with Strattera Motrin and Lexapro  Review of systems he denies any numbness or tingling he says that the right shoulder feels like it's clicking or popping. There is no evidence of erythema over the left shoulder although there is a lot of swelling and ecchymosis Review of Systems See hpi  Past Medical History  Diagnosis Date  . Depressed   . ADHD (attention deficit hyperactivity disorder)   . Dislocation of left shoulder joint   . Fracture of humeral head, left, closed     No Known Allergies  Current Outpatient Prescriptions  Medication Sig Dispense Refill  . escitalopram (LEXAPRO) 20 MG tablet Take 20 mg by mouth daily.  10  . HYDROcodone-acetaminophen (NORCO/VICODIN) 5-325 MG per tablet Take 1-2 tablets by mouth every 6 (six) hours as needed. 20 tablet 0  . HYDROmorphone (DILAUDID) 4 MG tablet Take 1 tablet (4 mg total) by mouth every 6 (six) hours as needed for  severe pain. 20 tablet 0  . ibuprofen (ADVIL,MOTRIN) 200 MG tablet Take 600 mg by mouth every 6 (six) hours as needed for mild pain or moderate pain.     Marland Kitchen STRATTERA 80 MG capsule Take 80 mg by mouth daily.  10  . HYDROmorphone (DILAUDID) 4 MG tablet Take 1 tablet (4 mg total) by mouth every 4 (four) hours as needed for severe pain. 60 tablet 0   No current facility-administered medications for this visit.     Physical Exam Blood pressure 146/98, height 6' (1.829 m), weight 220 lb (99.791 kg). Physical Exam  The patient is well developed well nourished and well groomed.   Orientation to person place and time is normal   Mood is pleasant.  Ambulatory status he walks in in a shoulder immobilizer without any difficulty  Inspection: Of the left shoulder show swelling tenderness and ecchymosis  ROM: Not tested  Stability: Not tested  Motor exam: The hand and wrist are working normally  Skin: He most this is noted left upper arm down to the elbow  Neuro: Normal sensation  Vascular: 2+ radial and ulnar pulses  Lymph: Normal lymph nodes  The right shoulder moves normally I did not detect any instability   Data Reviewed Read xrays  pre-and post reduction x-rays show radial tuberosity fracture dislocation left shoulder with good reduction of the glenohumeral joint there appears to be displacement of the greater tuberosity Reports reviewed: X-ray reports of the pre-and postreduction films  were reviewed  Diagnosis New with further work up CT scan follow up with [place]   Management Continue immobilizer continue Dilaudid stopped Norco continue ibuprofen

## 2015-05-26 ENCOUNTER — Ambulatory Visit (HOSPITAL_COMMUNITY): Payer: BLUE CROSS/BLUE SHIELD

## 2015-05-26 ENCOUNTER — Ambulatory Visit (HOSPITAL_COMMUNITY)
Admission: RE | Admit: 2015-05-26 | Discharge: 2015-05-26 | Disposition: A | Discharge status: Home / Self Care | Payer: BLUE CROSS/BLUE SHIELD | Source: Ambulatory Visit | Attending: Orthopedic Surgery | Admitting: Orthopedic Surgery

## 2015-05-26 DIAGNOSIS — X58XXXA Exposure to other specified factors, initial encounter: Secondary | ICD-10-CM | POA: Diagnosis not present

## 2015-05-26 DIAGNOSIS — S4292XA Fracture of left shoulder girdle, part unspecified, initial encounter for closed fracture: Secondary | ICD-10-CM

## 2015-05-26 DIAGNOSIS — S42292A Other displaced fracture of upper end of left humerus, initial encounter for closed fracture: Secondary | ICD-10-CM | POA: Insufficient documentation

## 2015-05-26 DIAGNOSIS — M25512 Pain in left shoulder: Secondary | ICD-10-CM | POA: Insufficient documentation

## 2015-05-31 ENCOUNTER — Ambulatory Visit (INDEPENDENT_AMBULATORY_CARE_PROVIDER_SITE_OTHER): Payer: BLUE CROSS/BLUE SHIELD | Admitting: Orthopedic Surgery

## 2015-05-31 VITALS — BP 127/87 | Ht 72.0 in | Wt 220.0 lb

## 2015-05-31 DIAGNOSIS — S4292XA Fracture of left shoulder girdle, part unspecified, initial encounter for closed fracture: Secondary | ICD-10-CM | POA: Diagnosis not present

## 2015-05-31 MED ORDER — HYDROMORPHONE HCL 4 MG PO TABS: 4.0000 mg | ORAL_TABLET | ORAL | Status: DC | PRN

## 2015-05-31 NOTE — Patient Instructions (Signed)
SURGERY- OTIF LEFT SHOULDER Friday 06/03/15

## 2015-05-31 NOTE — Addendum Note (Signed)
Addended by: Adella Hare B on: 05/31/2015 11:27 AM   Modules accepted: Orders

## 2015-05-31 NOTE — Progress Notes (Signed)
Follow-up visit status post fracture dislocation left shoulder. CT scan obtained to better image shoulder fracture  Greater tuberosity fracture is displaced  Recommend open treatment internal fixation left shoulder. Patient aware. Risk benefits explained. Postop course explained. Patient scheduled for surgery. History and physical dictated later but incorporated by reference today. 

## 2015-06-01 ENCOUNTER — Telehealth: Payer: Self-pay | Admitting: Orthopedic Surgery

## 2015-06-01 NOTE — Telephone Encounter (Signed)
Regarding out-patient surgery scheduled at [facility] June 18, 2015, CPT codes 16109, and also 23620, [phone] - contacted insurer [employer] [phone]; per Care Management representative Ronnald Ramp, no pre-authorization required.  His name and today's date, 06/01/15, 10:06AM, for reference.

## 2015-06-01 NOTE — Patient Instructions (Signed)
George [name]  06/01/2015     @PREFPERIOPPHARMACY @   Your procedure is scheduled on  06/03/2015  Report to [facility] at  1050  A.M.  Call this number if you have problems the morning of surgery:  [phone]   Remember:  Do not eat food or drink liquids after midnight.  Take these medicines the morning of surgery with A SIP OF WATER lexapro, strattera, Hydrocodone or dilaudud.   Do not wear jewelry, make-up or nail polish.  Do not wear lotions, powders, or perfumes.    Do not shave 48 hours prior to surgery.  Men may shave face and neck.  Do not bring valuables to the hospital.  [facility] is not responsible for any belongings or valuables.  Contacts, dentures or bridgework may not be worn into surgery.  Leave your suitcase in the car.  After surgery it may be brought to your room.  For patients admitted to the hospital, discharge time will be determined by your treatment team.  Patients discharged the day of surgery will not be allowed to drive home.   Name and phone number of your driver:   parents Special instructions:  none  Please read over the following fact sheets that you were given. Pain Booklet, Coughing and Deep Breathing, Surgical Site Infection Prevention, Anesthesia Post-op Instructions and Care and Recovery After Surgery      Humerus Fracture, Treated with Open Reduction You have a fracture (break in bone) of your humerus. This is the large bone in your upper arm. These fractures are easily diagnosed with x-rays. TREATMENT  Simple fractures that will heal without disability are treated with simple immobilization. This is often followed with early range of motion exercises to keep the shoulder from becoming frozen (stuck in one position). Your caregiver feels you have an unstable fracture. Unstable fractures are treated with an open reduction (operation) and internal fixation. A screw is put into the fracture to hold the bone pieces in  position while they heal. This is done to obtain the best possible result for shoulder function. These fixation devices are often left in place after healing. They generally do not cause problems and you usually will not know they are there. RELATED COMPLICATIONS The most common complication of upper arm fractures is adhesive capsulitis. This means the shoulder is frozen or difficult to move. Other complications include infection, non-union or malunion of the bones. This means the bones do not heal in the correct position or direction. BEFORE AND AFTER YOUR SURGERY Prior to surgery, an IV (intravenous line connected to your vein for giving fluids) may be started. You will be given an anesthetic (medications and gas to make you sleep). After surgery, you will be taken to the recovery area where a nurse will watch your progress. You may have a catheter (a long, narrow, hollow tube) in your bladder that helps you pass your water. Once you're awake, stable, and taking fluids well, you'll be returned to your room. You will receive physical therapy and other care until you are doing well and your caregiver feels it is safe for you to be transferred either to home or to an extended care facility. HOME CARE INSTRUCTIONS  You may resume normal diet and activities as directed or allowed.  Change dressings if necessary or as instructed.  Only take over-the-counter or prescription medicines for pain, discomfort, or fever as directed by your caregiver. SEEK IMMEDIATE MEDICAL CARE IF:  There is redness, swelling, or increasing pain in the wound.  There is pus coming from wound.  An unexplained oral temperature above 102 F (38.9 C) develops, or as your caregiver suggests.  A bad smell is coming from the wound or dressing.  The edges of the wound are not staying together after sutures or staples have been removed. MAKE SURE YOU:   Understand these instructions.  Will watch your condition.  Will get  help right away if you are not doing well or get worse. Document Released: 03/27/2001 Document Revised: 12/24/2011 Document Reviewed: 05/21/2008 [facility] Patient Information 2015 [place], [place]. This information is not intended to replace advice given to you by your health care provider. Make sure you discuss any questions you have with your health care provider. PATIENT INSTRUCTIONS POST-ANESTHESIA  IMMEDIATELY FOLLOWING SURGERY:  Do not drive or operate machinery for the first twenty four hours after surgery.  Do not make any important decisions for twenty four hours after surgery or while taking narcotic pain medications or sedatives.  If you develop intractable nausea and vomiting or a severe headache please notify your doctor immediately.  FOLLOW-UP:  Please make an appointment with your surgeon as instructed. You do not need to follow up with anesthesia unless specifically instructed to do so.  WOUND CARE INSTRUCTIONS (if applicable):  Keep a dry clean dressing on the anesthesia/puncture wound site if there is drainage.  Once the wound has quit draining you may leave it open to air.  Generally you should leave the bandage intact for twenty four hours unless there is drainage.  If the epidural site drains for more than 36-48 hours please call the anesthesia department.  QUESTIONS?:  Please feel free to call your physician or the hospital operator if you have any questions, and they will be happy to assist you.

## 2015-06-02 ENCOUNTER — Other Ambulatory Visit: Payer: Self-pay | Admitting: *Deleted

## 2015-06-02 ENCOUNTER — Encounter (HOSPITAL_COMMUNITY): Payer: Self-pay

## 2015-06-02 ENCOUNTER — Encounter (HOSPITAL_COMMUNITY)
Admission: RE | Admit: 2015-06-02 | Discharge: 2015-06-02 | Disposition: A | Discharge status: Home / Self Care | Payer: BLUE CROSS/BLUE SHIELD | Source: Ambulatory Visit | Attending: Orthopedic Surgery | Admitting: Orthopedic Surgery

## 2015-06-02 DIAGNOSIS — F17200 Nicotine dependence, unspecified, uncomplicated: Secondary | ICD-10-CM | POA: Diagnosis not present

## 2015-06-02 DIAGNOSIS — F329 Major depressive disorder, single episode, unspecified: Secondary | ICD-10-CM | POA: Diagnosis not present

## 2015-06-02 DIAGNOSIS — F909 Attention-deficit hyperactivity disorder, unspecified type: Secondary | ICD-10-CM | POA: Diagnosis not present

## 2015-06-02 DIAGNOSIS — Z79899 Other long term (current) drug therapy: Secondary | ICD-10-CM | POA: Diagnosis not present

## 2015-06-02 DIAGNOSIS — X58XXXA Exposure to other specified factors, initial encounter: Secondary | ICD-10-CM | POA: Diagnosis not present

## 2015-06-02 DIAGNOSIS — S42252A Displaced fracture of greater tuberosity of left humerus, initial encounter for closed fracture: Secondary | ICD-10-CM | POA: Diagnosis present

## 2015-06-02 LAB — CBC
HEMATOCRIT: 42.7 % (ref 39.0–52.0)
HEMOGLOBIN: 14.2 g/dL (ref 13.0–17.0)
MCH: 32.1 pg (ref 26.0–34.0)
MCHC: 33.3 g/dL (ref 30.0–36.0)
MCV: 96.6 fL (ref 78.0–100.0)
Platelets: 395 10*3/uL (ref 150–400)
RBC: 4.42 MIL/uL (ref 4.22–5.81)
RDW: 13.1 % (ref 11.5–15.5)
WBC: 8.1 10*3/uL (ref 4.0–10.5)

## 2015-06-03 ENCOUNTER — Ambulatory Visit (HOSPITAL_COMMUNITY): Payer: BLUE CROSS/BLUE SHIELD | Admitting: Anesthesiology

## 2015-06-03 ENCOUNTER — Encounter (HOSPITAL_COMMUNITY)
Admission: RE | Disposition: A | Discharge status: Home / Self Care | Payer: Self-pay | Source: Ambulatory Visit | Attending: Orthopedic Surgery

## 2015-06-03 ENCOUNTER — Ambulatory Visit (HOSPITAL_COMMUNITY)
Admission: RE | Admit: 2015-06-03 | Discharge: 2015-06-03 | Disposition: A | Discharge status: Home / Self Care | Payer: BLUE CROSS/BLUE SHIELD | Source: Ambulatory Visit | Attending: Orthopedic Surgery | Admitting: Orthopedic Surgery

## 2015-06-03 ENCOUNTER — Encounter (HOSPITAL_COMMUNITY): Payer: Self-pay | Admitting: *Deleted

## 2015-06-03 ENCOUNTER — Ambulatory Visit (HOSPITAL_COMMUNITY): Payer: BLUE CROSS/BLUE SHIELD

## 2015-06-03 DIAGNOSIS — S42252A Displaced fracture of greater tuberosity of left humerus, initial encounter for closed fracture: Secondary | ICD-10-CM | POA: Diagnosis not present

## 2015-06-03 DIAGNOSIS — X58XXXA Exposure to other specified factors, initial encounter: Secondary | ICD-10-CM | POA: Insufficient documentation

## 2015-06-03 DIAGNOSIS — F17200 Nicotine dependence, unspecified, uncomplicated: Secondary | ICD-10-CM | POA: Insufficient documentation

## 2015-06-03 DIAGNOSIS — F909 Attention-deficit hyperactivity disorder, unspecified type: Secondary | ICD-10-CM | POA: Insufficient documentation

## 2015-06-03 DIAGNOSIS — Z79899 Other long term (current) drug therapy: Secondary | ICD-10-CM | POA: Insufficient documentation

## 2015-06-03 DIAGNOSIS — F329 Major depressive disorder, single episode, unspecified: Secondary | ICD-10-CM | POA: Insufficient documentation

## 2015-06-03 HISTORY — PX: ORIF HUMERUS FRACTURE: SHX2126

## 2015-06-03 SURGERY — OPEN REDUCTION INTERNAL FIXATION (ORIF) PROXIMAL HUMERUS FRACTURE
Anesthesia: General | Site: Shoulder | Laterality: Left

## 2015-06-03 MED ORDER — CHLORHEXIDINE GLUCONATE 4 % EX LIQD: 60.0000 mL | Freq: Once | CUTANEOUS | Status: DC

## 2015-06-03 MED ORDER — GLYCOPYRROLATE 0.2 MG/ML IJ SOLN
INTRAMUSCULAR | Status: AC
  Filled 2015-06-03: qty 3

## 2015-06-03 MED ORDER — HYDROMORPHONE HCL 1 MG/ML IJ SOLN
INTRAMUSCULAR | Status: AC
  Filled 2015-06-03: qty 1

## 2015-06-03 MED ORDER — NEOSTIGMINE METHYLSULFATE 10 MG/10ML IV SOLN
INTRAVENOUS | Status: DC | PRN
  Administered 2015-06-03: 4 mg via INTRAVENOUS

## 2015-06-03 MED ORDER — CEFAZOLIN SODIUM-DEXTROSE 2-3 GM-% IV SOLR: 2.0000 g | INTRAVENOUS | Status: DC

## 2015-06-03 MED ORDER — NEOSTIGMINE METHYLSULFATE 10 MG/10ML IV SOLN
INTRAVENOUS | Status: AC
  Filled 2015-06-03: qty 1

## 2015-06-03 MED ORDER — GLYCOPYRROLATE 0.2 MG/ML IJ SOLN
INTRAMUSCULAR | Status: AC
  Filled 2015-06-03: qty 1

## 2015-06-03 MED ORDER — HYDROMORPHONE HCL 1 MG/ML IJ SOLN
0.2500 mg | INTRAMUSCULAR | Status: DC | PRN
  Administered 2015-06-03 (×7): 0.5 mg via INTRAVENOUS
  Filled 2015-06-03 (×3): qty 1

## 2015-06-03 MED ORDER — SUFENTANIL CITRATE 50 MCG/ML IV SOLN
INTRAVENOUS | Status: AC
  Filled 2015-06-03: qty 1

## 2015-06-03 MED ORDER — LACTATED RINGERS IV SOLN
INTRAVENOUS | Status: DC
  Administered 2015-06-03 (×2): via INTRAVENOUS

## 2015-06-03 MED ORDER — SEVOFLURANE IN SOLN
RESPIRATORY_TRACT | Status: AC
  Filled 2015-06-03: qty 250

## 2015-06-03 MED ORDER — BUPIVACAINE-EPINEPHRINE (PF) 0.5% -1:200000 IJ SOLN
INTRAMUSCULAR | Status: AC
  Filled 2015-06-03: qty 60

## 2015-06-03 MED ORDER — LIDOCAINE HCL (PF) 1 % IJ SOLN
INTRAMUSCULAR | Status: AC
  Filled 2015-06-03: qty 5

## 2015-06-03 MED ORDER — DEXAMETHASONE SODIUM PHOSPHATE 4 MG/ML IJ SOLN
INTRAMUSCULAR | Status: AC
  Filled 2015-06-03: qty 2

## 2015-06-03 MED ORDER — PROMETHAZINE HCL 12.5 MG PO TABS: 12.5000 mg | ORAL_TABLET | Freq: Four times a day (QID) | ORAL | Status: DC | PRN

## 2015-06-03 MED ORDER — IBUPROFEN 800 MG PO TABS: 800.0000 mg | ORAL_TABLET | Freq: Three times a day (TID) | ORAL | Status: DC | PRN

## 2015-06-03 MED ORDER — MIDAZOLAM HCL 2 MG/2ML IJ SOLN
1.0000 mg | INTRAMUSCULAR | Status: DC | PRN
  Administered 2015-06-03 (×2): 2 mg via INTRAVENOUS
  Filled 2015-06-03: qty 2

## 2015-06-03 MED ORDER — ROCURONIUM BROMIDE 50 MG/5ML IV SOLN
INTRAVENOUS | Status: AC
  Filled 2015-06-03: qty 1

## 2015-06-03 MED ORDER — SUFENTANIL CITRATE 50 MCG/ML IV SOLN
INTRAVENOUS | Status: DC | PRN
  Administered 2015-06-03 (×3): 10 ug via INTRAVENOUS
  Administered 2015-06-03: 5 ug via INTRAVENOUS
  Administered 2015-06-03 (×3): 10 ug via INTRAVENOUS
  Administered 2015-06-03: 5 ug via INTRAVENOUS

## 2015-06-03 MED ORDER — LIDOCAINE HCL (CARDIAC) 20 MG/ML IV SOLN
INTRAVENOUS | Status: DC | PRN
  Administered 2015-06-03: 50 mg via INTRAVENOUS

## 2015-06-03 MED ORDER — DEXAMETHASONE SODIUM PHOSPHATE 4 MG/ML IJ SOLN
INTRAMUSCULAR | Status: DC | PRN
  Administered 2015-06-03: 8 mg via INTRAVENOUS

## 2015-06-03 MED ORDER — ROCURONIUM BROMIDE 100 MG/10ML IV SOLN
INTRAVENOUS | Status: DC | PRN
  Administered 2015-06-03: 10 mg via INTRAVENOUS
  Administered 2015-06-03: 40 mg via INTRAVENOUS

## 2015-06-03 MED ORDER — SODIUM CHLORIDE 0.9 % IJ SOLN
INTRAMUSCULAR | Status: AC
  Filled 2015-06-03: qty 10

## 2015-06-03 MED ORDER — ONDANSETRON HCL 4 MG/2ML IJ SOLN
4.0000 mg | Freq: Once | INTRAMUSCULAR | Status: AC
  Administered 2015-06-03: 4 mg via INTRAVENOUS

## 2015-06-03 MED ORDER — MIDAZOLAM HCL 2 MG/2ML IJ SOLN
INTRAMUSCULAR | Status: AC
  Filled 2015-06-03: qty 2

## 2015-06-03 MED ORDER — ONDANSETRON HCL 4 MG/2ML IJ SOLN: 4.0000 mg | Freq: Once | INTRAMUSCULAR | Status: DC | PRN

## 2015-06-03 MED ORDER — FENTANYL CITRATE (PF) 100 MCG/2ML IJ SOLN: INTRAMUSCULAR | Status: DC | PRN

## 2015-06-03 MED ORDER — GLYCOPYRROLATE 0.2 MG/ML IJ SOLN
INTRAMUSCULAR | Status: DC | PRN
  Administered 2015-06-03: 0.6 mg via INTRAVENOUS
  Administered 2015-06-03: 0.2 mg via INTRAVENOUS

## 2015-06-03 MED ORDER — BUPIVACAINE-EPINEPHRINE 0.5% -1:200000 IJ SOLN
INTRAMUSCULAR | Status: DC | PRN
  Administered 2015-06-03: 50 mL

## 2015-06-03 MED ORDER — ONDANSETRON HCL 4 MG/2ML IJ SOLN
INTRAMUSCULAR | Status: AC
  Filled 2015-06-03: qty 2

## 2015-06-03 MED ORDER — FENTANYL CITRATE (PF) 100 MCG/2ML IJ SOLN: 25.0000 ug | INTRAMUSCULAR | Status: DC | PRN

## 2015-06-03 MED ORDER — SODIUM CHLORIDE 0.9 % IR SOLN
Status: DC | PRN
  Administered 2015-06-03: 1000 mL

## 2015-06-03 MED ORDER — OXYCODONE-ACETAMINOPHEN 5-325 MG PO TABS: 1.0000 | ORAL_TABLET | ORAL | Status: DC | PRN

## 2015-06-03 MED ORDER — CEFAZOLIN SODIUM-DEXTROSE 2-3 GM-% IV SOLR
2.0000 g | INTRAVENOUS | Status: AC
  Administered 2015-06-03: 2 g via INTRAVENOUS
  Filled 2015-06-03: qty 50

## 2015-06-03 MED ORDER — PROPOFOL 10 MG/ML IV BOLUS
INTRAVENOUS | Status: DC | PRN
  Administered 2015-06-03: 180 mg via INTRAVENOUS

## 2015-06-03 SURGICAL SUPPLY — 80 items
ANCH SUT 2 CRKSCW 15.5X6.5 (Anchor) ×1 IMPLANT
ANCHOR CORKSCREW 6.5 FIBERWIRE (Anchor) ×2 IMPLANT
BAG HAMPER (MISCELLANEOUS) ×3 IMPLANT
BANDAGE ELASTIC 4 VELCRO NS (GAUZE/BANDAGES/DRESSINGS) IMPLANT
BANDAGE ELASTIC 6 VELCRO NS (GAUZE/BANDAGES/DRESSINGS) IMPLANT
BANDAGE ESMARK 4X12 BL STRL LF (DISPOSABLE) IMPLANT
BIT DRILL 2.0X128 (BIT) ×1 IMPLANT
BIT DRILL 2.0X128MM (BIT) ×1
BIT DRILL CANN 3.2MM (BIT) IMPLANT
BLADE HEX COATED 2.75 (ELECTRODE) ×3 IMPLANT
BLADE SURG SZ10 CARB STEEL (BLADE) ×3 IMPLANT
BLADE SURG SZ11 CARB STEEL (BLADE) ×3 IMPLANT
BNDG CMPR 12X4 ELC STRL LF (DISPOSABLE)
BNDG COHESIVE 4X5 TAN STRL (GAUZE/BANDAGES/DRESSINGS) ×3 IMPLANT
BNDG ESMARK 4X12 BLUE STRL LF (DISPOSABLE)
CHLORAPREP W/TINT 26ML (MISCELLANEOUS) ×3 IMPLANT
CLOTH BEACON ORANGE TIMEOUT ST (SAFETY) ×3 IMPLANT
COOLER CRYO CUFF IC AND MOTOR (MISCELLANEOUS) ×1 IMPLANT
COVER LIGHT HANDLE STERIS (MISCELLANEOUS) ×12 IMPLANT
CUFF CRYO UNI SHDR 32X48 (MISCELLANEOUS) ×1 IMPLANT
DECANTER SPIKE VIAL GLASS SM (MISCELLANEOUS) ×3 IMPLANT
DRAPE C-ARM FOLDED MOBILE STRL (DRAPES) ×3 IMPLANT
DRAPE ORTHO 2.5IN SPLIT 77X108 (DRAPES) ×2 IMPLANT
DRAPE ORTHO SPLIT 77X108 STRL (DRAPES) ×6
DRAPE PROXIMA HALF (DRAPES) ×4 IMPLANT
DRAPE U-SHAPE 47X51 STRL (DRAPES) ×4 IMPLANT
DRESSING ALLEVYN BORDER HEEL (GAUZE/BANDAGES/DRESSINGS) ×1 IMPLANT
DRESSING MEPILEX BORDER 6X8 (GAUZE/BANDAGES/DRESSINGS) IMPLANT
DRILL BIT CANN 3.2MM (BIT) ×2
DRSG MEPILEX BORDER 6X8 (GAUZE/BANDAGES/DRESSINGS) ×3
ELECT REM PT RETURN 9FT ADLT (ELECTROSURGICAL) ×3
ELECTRODE REM PT RTRN 9FT ADLT (ELECTROSURGICAL) ×1 IMPLANT
GLOVE BIO SURGEON STRL SZ7 (GLOVE) ×4 IMPLANT
GLOVE BIOGEL PI IND STRL 7.0 (GLOVE) IMPLANT
GLOVE BIOGEL PI IND STRL 7.5 (GLOVE) IMPLANT
GLOVE BIOGEL PI INDICATOR 7.0 (GLOVE) ×4
GLOVE BIOGEL PI INDICATOR 7.5 (GLOVE) ×2
GLOVE EXAM NITRILE MD LF STRL (GLOVE) ×2 IMPLANT
GLOVE SKINSENSE NS SZ8.0 LF (GLOVE) ×2
GLOVE SKINSENSE STRL SZ8.0 LF (GLOVE) ×1 IMPLANT
GLOVE SS N UNI LF 8.5 STRL (GLOVE) ×3 IMPLANT
GOWN STRL REUS W/TWL LRG LVL3 (GOWN DISPOSABLE) ×8 IMPLANT
GOWN STRL REUS W/TWL XL LVL3 (GOWN DISPOSABLE) ×3 IMPLANT
GUIDEWIRE THREADED 1.6 (WIRE) ×2 IMPLANT
IMMOBILIZER SHDR 7X13SM BLK (ORTHOPEDIC SUPPLIES) IMPLANT
IMMOBILIZER SHOULDER LGE (ORTHOPEDIC SUPPLIES) IMPLANT
IMMOBILIZER SHOULDER MED (ORTHOPEDIC SUPPLIES) IMPLANT
INST SET MINOR BONE (KITS) ×3 IMPLANT
K-WIRE ×4 IMPLANT
K-WIRE (.062X9) ×2 IMPLANT
KIT BLADEGUARD II DBL (SET/KITS/TRAYS/PACK) ×3 IMPLANT
KIT ROOM TURNOVER APOR (KITS) ×3 IMPLANT
MANIFOLD NEPTUNE II (INSTRUMENTS) ×3 IMPLANT
MARKER SKIN DUAL TIP RULER LAB (MISCELLANEOUS) ×3 IMPLANT
NDL HYPO 21X1.5 SAFETY (NEEDLE) ×1 IMPLANT
NEEDLE HYPO 21X1.5 SAFETY (NEEDLE) ×3 IMPLANT
NS IRRIG 1000ML POUR BTL (IV SOLUTION) ×3 IMPLANT
PACK BASIC III (CUSTOM PROCEDURE TRAY) ×3
PACK SRG BSC III STRL LF ECLPS (CUSTOM PROCEDURE TRAY) ×1 IMPLANT
PAD CAST 4YDX4 CTTN HI CHSV (CAST SUPPLIES) IMPLANT
PADDING CAST COTTON 4X4 STRL (CAST SUPPLIES)
PENCIL HANDSWITCHING (ELECTRODE) ×3 IMPLANT
SCREW CANN 50MM FULLY (Screw) ×2 IMPLANT
SCREW CANN 60MM FULLY (Screw) ×2 IMPLANT
SET BASIN LINEN APH (SET/KITS/TRAYS/PACK) ×3 IMPLANT
SLING ARM FOAM STRAP XLG (SOFTGOODS) ×1 IMPLANT
SLING ARM LRG ADULT FOAM STRAP (SOFTGOODS) ×3 IMPLANT
SLING ARM MED ADULT FOAM STRAP (SOFTGOODS) ×1 IMPLANT
SPONGE LAP 18X18 X RAY DECT (DISPOSABLE) ×6 IMPLANT
STAPLER VISISTAT 35W (STAPLE) ×3 IMPLANT
STOCKINETTE IMPERVIOUS LG (DRAPES) ×3 IMPLANT
SUT ETHIBOND 5 LR DA (SUTURE) ×4 IMPLANT
SUT ETHIBOND NAB OS 4 #2 30IN (SUTURE) ×4 IMPLANT
SUT MON AB 0 CT1 (SUTURE) ×3 IMPLANT
SUT MON AB 2-0 CT1 36 (SUTURE) ×3 IMPLANT
SYR 30ML LL (SYRINGE) ×3 IMPLANT
SYR BULB IRRIGATION 50ML (SYRINGE) ×2 IMPLANT
TOWEL OR 17X26 4PK STRL BLUE (TOWEL DISPOSABLE) ×3 IMPLANT
WASHER FOR 4.5 SCREWS (Washer) ×4 IMPLANT
YANKAUER SUCT BULB TIP 10FT TU (MISCELLANEOUS) ×3 IMPLANT

## 2015-06-03 NOTE — Op Note (Signed)
06/03/2015  3:14 PM  PATIENT:  George [name]  24 y.o. male  PRE-OPERATIVE DIAGNOSIS:  left greater tuberosity fracture  POST-OPERATIVE DIAGNOSIS:  left greater tuberosity fracture  PROCEDURE:  Procedure(s): OPEN REDUCTION INTERNAL FIXATION LEFT PROXIMAL HUMERUS FRACTURE (Left)  Comminuted fracture greater tuberosity with inferior and external rotation deformity of the fracture fragment  4.5 cannulated screws were used with 2 #5 Ethibond sutures used as a tension band with washers under each screw and then a 6.5 Arthrex corkscrew anchor using the 2 sutures to help prepare the avulsion sleeve injury to the anterior periosteum and biceps tendon and rotator cuff  The procedure was done as follows The surgical site was marked identified and countersigned as left shoulder. Chart review was completed. The consent was signed.  The patient was taken to the operating room for general anesthesia which was successful without complication  The C-arm was brought once the patient was positioned to make sure adequate films were obtainable.  The arm was then prepped and draped sterilely. Timeout was completed.    Assisted by [place]  Gen. anesthesia  EBL:  Total I/O In: 1200 [I.V.:1200] Out: 150 [Blood:150]  BLOOD ADMINISTERED:none  DRAINS: none   LOCAL MEDICATIONS USED:  MARCAINE     SPECIMEN:  No Specimen  DISPOSITION OF SPECIMEN:  N/A  COUNTS:  YES  TOURNIQUET:  * No tourniquets in log *  DICTATION: .Dragon Dictation  PLAN OF CARE: Discharge to home after PACU  PATIENT DISPOSITION:  PACU - hemodynamically stable.   Delay start of Pharmacological VTE agent (>24hrs) due to surgical blood loss or risk of bleeding: not applicable  Site marking was performed. The left shoulder was confirmed as a surgical site  Timeout was completed after chloroprep and sterile draping  The incision was made for a rotator cuff repair starting at the anterolateral acromion and  extending distally in the deltoid medial and anterior interval. Subcutaneous tissue was divided. The deltoid was split and a portion of it was removed from the lateral acromion. Resect me was performed. Fracture was elevated from the and the bony bed was evaluated and irrigated. The back of the fracture was also irrigated and debrided. K wires were used to provisionally fix the fracture and C-arm x-rays confirmed fracture reduction. This is a very difficult reduction in the arm had to be manipulated into external rotation and abduction and the soft tissues around the fracture fragment and rotator cuff had to be released and dissected with blunt dissection.  But once we got a good reduction we used 4.5 cannulated screws we placed 2 #5 Ethibond sutures in the rotator cuff and passed them around the screws with washers. X-rays confirm the reduction and the position of the hardware was good.  We then placed a corkscrew anchor in the anterior portion of the tuberosity fragment and it was significant sleeve avulsion which was repaired with a corkscrew anchor using 2 sutures through the anchor.  The wound was irrigated. The deltoid split was repaired with 2 Ethibond suture and a drill hole was placed in the acromion and a #2 Ethibond suture was passed through it to repair the removed portion of the deltoid. This made and anatomic repair.  The subcutaneous tissue was closed with 01 2-0 Monocryl and reapproximated with staples and we injected 60 mL of Marcaine  Sterile dressing was applied  Shoulder immobilizer was applied  The patient was extubated and returned to recovery room  We should note that the patient had  limited external rotation of his arm. We meticulously looked at fluoroscopy and radiographs to confirm that there was no dislocation posteriorly. This apparently is a contracture after his injury.

## 2015-06-03 NOTE — Progress Notes (Signed)
From OR. Left fingers pink in color/warm to touch. Left radial pulse palpable.

## 2015-06-03 NOTE — Interval H&P Note (Signed)
History and Physical Interval Note:  06/03/2015 12:54 PM  George [name]  has presented today for surgery, with the diagnosis of left greater tuberosity fracture  The various methods of treatment have been discussed with the patient and family. After consideration of risks, benefits and other options for treatment, the patient has consented to  Procedure(s) with comments: OPEN REDUCTION INTERNAL FIXATION (ORIF) PROXIMAL HUMERUS FRACTURE (Left) - greater tuberosity fracture as a surgical intervention .  The patient's history has been reviewed, patient examined, no change in status, stable for surgery.  I have reviewed the patient's chart and labs.  Questions were answered to the patient's satisfaction.     Fuller Canada

## 2015-06-03 NOTE — Anesthesia Procedure Notes (Signed)
Procedure Name: Intubation Date/Time: 06/03/2015 1:17 PM Performed by: Pernell Dupre, Isayah Ignasiak A Pre-anesthesia Checklist: Timeout performed, Patient identified, Emergency Drugs available, Suction available and Patient being monitored Patient Re-evaluated:Patient Re-evaluated prior to inductionOxygen Delivery Method: Circle System Utilized Preoxygenation: Pre-oxygenation with 100% oxygen Intubation Type: IV induction Ventilation: Mask ventilation without difficulty Laryngoscope Size: 3 and Miller Grade View: Grade I Tube type: Oral Tube size: 7.0 mm Number of attempts: 1 Airway Equipment and Method: Stylet Placement Confirmation: ETT inserted through vocal cords under direct vision,  positive ETCO2 and breath sounds checked- equal and bilateral Secured at: 21 cm Tube secured with: Tape Dental Injury: Teeth and Oropharynx as per pre-operative assessment

## 2015-06-03 NOTE — Discharge Instructions (Signed)
Humerus Fracture, Treated with Open Reduction Care After Please read the instructions outlined below and refer to this sheet in the next few weeks. These discharge instructions provide you with general information on caring for yourself after you leave the hospital or out patient surgery center. Your caregiver may also give you specific instructions. If you have any problems or questions after discharge, please call your caregiver. HOME CARE INSTRUCTIONS   It is normal to have pain for a couple of weeks following surgery. See your caregiver if this seems to be getting worse rather than better.  Only take over-the-counter or prescription medicines for pain, discomfort, or fever as directed by your caregiver.  Use showers for bathing, until seen or as instructed.  Change dressings as directed.  You may resume normal diet and activities as directed or allowed.  Use you sling or splint and avoid lifting until you are instructed otherwise.  Make an appointment to see your caregiver for stitch or staple removal when instructed.  You may use ice on your incision for 15-20 minutes, 03-04 times per day for the first 2 days. SEEK MEDICAL CARE IF:   You have increased bleeding from your wound.  You see redness, swelling, or have increasing pain in your wound.  You have pus coming from your wound.  You develop an unexplained temperature over 102 F (38.9 C).  You have a foul smell coming from the wound or dressing.  You develop lightheadedness or feel faint.  You have numbness, tingling or weakness in your hand that was not present immediately after surgery. SEEK IMMEDIATE MEDICAL CARE IF:   You develop a rash.  You have difficulty breathing.  You develop any reaction or side effects to medications given.  You have fever greater than 102 F (38.9 C) that does not respond to medicine. MAKE SURE YOU:   Understand these instructions.  Will watch your condition.  Will get help  right away if you are not doing well or get worse. Document Released: 04/20/2005 Document Revised: 12/24/2011 Document Reviewed: 01/20/2009 [facility] Patient Information 2015 [place], [place]. This information is not intended to replace advice given to you by your health care provider. Make sure you discuss any questions you have with your health care provider.            PATIENT INSTRUCTIONS POST-ANESTHESIA  IMMEDIATELY FOLLOWING SURGERY:  Do not drive or operate machinery for the first twenty four hours after surgery.  Do not make any important decisions for twenty four hours after surgery or while taking narcotic pain medications or sedatives.  If you develop intractable nausea and vomiting or a severe headache please notify your doctor immediately.  FOLLOW-UP:  Please make an appointment with your surgeon as instructed. You do not need to follow up with anesthesia unless specifically instructed to do so.  WOUND CARE INSTRUCTIONS (if applicable):  Keep a dry clean dressing on the anesthesia/puncture wound site if there is drainage.  Once the wound has quit draining you may leave it open to air.  Generally you should leave the bandage intact for twenty four hours unless there is drainage.  If the epidural site drains for more than 36-48 hours please call the anesthesia department.  QUESTIONS?:  Please feel free to call your physician or the hospital operator if you have any questions, and they will be happy to assist you.

## 2015-06-03 NOTE — Interval H&P Note (Signed)
History and Physical Interval Note:  06/03/2015 12:56 PM  George [name]  has presented today for surgery, with the diagnosis of left greater tuberosity fracture  The various methods of treatment have been discussed with the patient and family. After consideration of risks, benefits and other options for treatment, the patient has consented to  Procedure(s) with comments: OPEN REDUCTION INTERNAL FIXATION (ORIF) PROXIMAL HUMERUS FRACTURE (Left) - greater tuberosity fracture as a surgical intervention .  The patient's history has been reviewed, patient examined, no change in status, stable for surgery.  I have reviewed the patient's chart and labs.  Questions were answered to the patient's satisfaction.     Fuller Canada

## 2015-06-03 NOTE — Transfer of Care (Signed)
Immediate Anesthesia Transfer of Care Note  Patient: George [name]  Procedure(s) Performed: Procedure(s): OPEN REDUCTION INTERNAL FIXATION LEFT PROXIMAL HUMERUS FRACTURE (Left)  Patient Location: PACU  Anesthesia Type:General  Level of Consciousness: awake, oriented and patient cooperative  Airway & Oxygen Therapy: Patient Spontanous Breathing and Patient connected to face mask oxygen  Post-op Assessment: Report given to RN and Post -op Vital signs reviewed and stable  Post vital signs: Reviewed and stable  Last Vitals:  Filed Vitals:   06/03/15 1210  BP: 116/85  Pulse:   Temp:   Resp: 17    Complications: No apparent anesthesia complications

## 2015-06-03 NOTE — Op Note (Signed)
06/03/2015  3:14 PM  PATIENT:  George [name]  24 y.o. male  PRE-OPERATIVE DIAGNOSIS:  left greater tuberosity fracture  POST-OPERATIVE DIAGNOSIS:  left greater tuberosity fracture  PROCEDURE:  Procedure(s): OPEN REDUCTION INTERNAL FIXATION LEFT PROXIMAL HUMERUS FRACTURE (Left)  Comminuted fracture greater tuberosity with inferior and external rotation deformity of the fracture fragment  4.5 cannulated screws were used with 2 #5 Ethibond sutures used as a tension band with washers under each screw and then a 6.5 Arthrex corkscrew anchor using the 2 sutures to help prepare the avulsion sleeve injury to the anterior periosteum and biceps tendon and rotator cuff  Assisted by [facility]  Gen. anesthesia  EBL:  Total I/O In: 1200 [I.V.:1200] Out: 150 [Blood:150]  BLOOD ADMINISTERED:none  DRAINS: none   LOCAL MEDICATIONS USED:  MARCAINE     SPECIMEN:  No Specimen  DISPOSITION OF SPECIMEN:  N/A  COUNTS:  YES  TOURNIQUET:  * No tourniquets in log *  DICTATION: .Dragon Dictation  PLAN OF CARE: Discharge to home after PACU  PATIENT DISPOSITION:  PACU - hemodynamically stable.   Delay start of Pharmacological VTE agent (>24hrs) due to surgical blood loss or risk of bleeding: not applicable  Site marking was performed. The left shoulder was confirmed as a surgical site  Timeout was completed after chloroprep and sterile draping  The incision was made for a rotator cuff repair starting at the anterolateral acromion and extending distally in the deltoid medial and anterior interval. Subcutaneous tissue was divided. The deltoid was split and a portion of it was removed from the lateral acromion. Resect me was performed. Fracture was elevated from the and the bony bed was evaluated and irrigated. The back of the fracture was also irrigated and debrided. K wires were used to provisionally fix the fracture and C-arm x-rays confirmed fracture reduction. This is a very difficult  reduction in the arm had to be manipulated into external rotation and abduction and the soft tissues around the fracture fragment and rotator cuff had to be released and dissected with blunt dissection.  But once we got a good reduction we used 4.5 cannulated screws we placed 2 #5 Ethibond sutures in the rotator cuff and passed them around the screws with washers. X-rays confirm the reduction and the position of the hardware was good.  We then placed a corkscrew anchor in the anterior portion of the tuberosity fragment and it was significant sleeve avulsion which was repaired with a corkscrew anchor using 2 sutures through the anchor.  The wound was irrigated. The deltoid split was repaired with 2 Ethibond suture and a drill hole was placed in the acromion and a #2 Ethibond suture was passed through it to repair the removed portion of the deltoid. This made and anatomic repair.  The subcutaneous tissue was closed with 01 2-0 Monocryl and reapproximated with staples and we injected 60 mL of Marcaine  Sterile dressing was applied  Shoulder immobilizer was applied  The patient was extubated and returned to recovery room  We should note that the patient had limited external rotation of his arm. We meticulously looked at fluoroscopy and radiographs to confirm that there was no dislocation posteriorly. This apparently is a contracture after his injury.

## 2015-06-03 NOTE — H&P (View-Only) (Signed)
Follow-up visit status post fracture dislocation left shoulder. CT scan obtained to better image shoulder fracture  Greater tuberosity fracture is displaced  Recommend open treatment internal fixation left shoulder. Patient aware. Risk benefits explained. Postop course explained. Patient scheduled for surgery. History and physical dictated later but incorporated by reference today.

## 2015-06-03 NOTE — Anesthesia Preprocedure Evaluation (Addendum)
Anesthesia Evaluation  Patient identified by MRN, date of birth, ID band Patient awake    Reviewed: Allergy & Precautions, NPO status , Patient's Chart, lab work & pertinent test results  Airway Mallampati: I  TM Distance: >3 FB     Dental  (+) Teeth Intact, Implants,    Pulmonary Current Smoker,  breath sounds clear to auscultation        Cardiovascular negative cardio ROS  Rhythm:Regular Rate:Normal     Neuro/Psych PSYCHIATRIC DISORDERS (AFHD) Depression Claims to have "blacked out" when [place]. No further episodes for last 2 wks.    GI/Hepatic negative GI ROS,   Endo/Other    Renal/GU      Musculoskeletal   Abdominal   Peds  Hematology   Anesthesia Other Findings   Reproductive/Obstetrics                            Anesthesia Physical Anesthesia Plan  ASA: II  Anesthesia Plan: General   Post-op Pain Management:    Induction: Intravenous  Airway Management Planned: Oral ETT  Additional Equipment:   Intra-op Plan:   Post-operative Plan: Extubation in OR  Informed Consent:   Plan Discussed with:   Anesthesia Plan Comments:         Anesthesia Quick Evaluation

## 2015-06-03 NOTE — Brief Op Note (Addendum)
06/03/2015  3:14 PM  PATIENT:  George [name]  24 y.o. male  PRE-OPERATIVE DIAGNOSIS:  left greater tuberosity fracture  POST-OPERATIVE DIAGNOSIS:  left greater tuberosity fracture  PROCEDURE:  Procedure(s): OPEN REDUCTION INTERNAL FIXATION LEFT PROXIMAL HUMERUS FRACTURE (Left)  Comminuted fracture greater tuberosity with inferior and external rotation deformity of the fracture fragment  4.5 cannulated screws were used with 2 #5 Ethibond sutures used as a tension band with washers under each screw and then a 6.5 Arthrex corkscrew anchor using the 2 sutures to help prepare the avulsion sleeve injury to the anterior periosteum and biceps tendon and rotator cuff  Assisted by [facility]  Gen. anesthesia  EBL:  Total I/O In: 1200 [I.V.:1200] Out: 150 [Blood:150]  BLOOD ADMINISTERED:none  DRAINS: none   LOCAL MEDICATIONS USED:  MARCAINE     SPECIMEN:  No Specimen  DISPOSITION OF SPECIMEN:  N/A  COUNTS:  YES  TOURNIQUET:  * No tourniquets in log *  DICTATION: .Dragon Dictation  PLAN OF CARE: Discharge to home after PACU  PATIENT DISPOSITION:  PACU - hemodynamically stable.   Delay start of Pharmacological VTE agent (>24hrs) due to surgical blood loss or risk of bleeding: not applicable  Site marking was performed. The left shoulder was confirmed as a surgical site  Timeout was completed after chloroprep and sterile draping  The incision was made for a rotator cuff repair starting at the anterolateral acromion and extending distally in the deltoid medial and anterior interval. Subcutaneous tissue was divided. The deltoid was split and a portion of it was removed from the lateral acromion. Resect me was performed. Fracture was elevated from the and the bony bed was evaluated and irrigated. The back of the fracture was also irrigated and debrided. K wires were used to provisionally fix the fracture and C-arm x-rays confirmed fracture reduction. This is a very difficult  reduction in the arm had to be manipulated into external rotation and abduction and the soft tissues around the fracture fragment and rotator cuff had to be released and dissected with blunt dissection.  But once we got a good reduction we used 4.5 cannulated screws we placed 2 #5 Ethibond sutures in the rotator cuff and passed them around the screws with washers. X-rays confirm the reduction and the position of the hardware was good.  We then placed a corkscrew anchor in the anterior portion of the tuberosity fragment and it was significant sleeve avulsion which was repaired with a corkscrew anchor using 2 sutures through the anchor.  The wound was irrigated. The deltoid split was repaired with 2 Ethibond suture and a drill hole was placed in the acromion and a #2 Ethibond suture was passed through it to repair the removed portion of the deltoid. This made and anatomic repair.  The subcutaneous tissue was closed with 01 2-0 Monocryl and reapproximated with staples and we injected 60 mL of Marcaine  Sterile dressing was applied  Shoulder immobilizer was applied  The patient was extubated and returned to recovery room  We should note that the patient had limited external rotation of his arm. We meticulously looked at fluoroscopy and radiographs to confirm that there was no dislocation posteriorly. This apparently is a contracture after his injury.  

## 2015-06-03 NOTE — Progress Notes (Addendum)
IS teaching done. Patient was given hibiclens bath but did not use last night or this morning. Washed with 6 cloths upon arrival.

## 2015-06-03 NOTE — H&P (Signed)
Status: Signed        Expand All Collapse All   Patient ID: George [name], male   DOB: 1991-03-26, 24 y.o.   MRN: [id number]  NEW    Chief Complaint   Patient presents with   .  Shoulder Injury       er follow up left shoulder fx + dislocation , DOI 05/17/15     George [name] is a 24 y.o. male.   HPI 24 year old male was involved in some type of [place] courthouse has a history of ADHD he sustained a fracture dislocation of his left shoulder documented by x-ray. He also says that his right shoulder also came out of place but spontaneously reduced and required no treatment. He was injured on 05/17/2015 brought to the ER x-ray showed a fracture dislocation of the left shoulder involving the left greater tuberosity and glenohumeral joint. He underwent closed reduction with good glenohumeral joint reduction on x-ray but possible continued displacement of the greater tuberosity which will require further imaging.  He comes in complaining of pain and swelling over the left shoulder 6 days associated with moderate to severe pain. He is on Norco and Dilaudid along with Strattera Motrin and Lexapro  Review of systems he denies any numbness or tingling he says that the right shoulder feels like it's clicking or popping. There is no evidence of erythema over the left shoulder although there is a lot of swelling and ecchymosis Review of Systems See hpi    Past Medical History   Diagnosis  Date   .  Depressed     .  ADHD (attention deficit hyperactivity disorder)     .  Dislocation of left shoulder joint     .  Fracture of humeral head, left, closed       No Known Allergies    Current Outpatient Prescriptions   Medication  Sig  Dispense  Refill   .  escitalopram (LEXAPRO) 20 MG tablet  Take 20 mg by mouth daily.    10   .  HYDROcodone-acetaminophen (NORCO/VICODIN) 5-325 MG per tablet  Take 1-2 tablets by mouth every 6 (six) hours as needed.  20 tablet  0   .  HYDROmorphone  (DILAUDID) 4 MG tablet  Take 1 tablet (4 mg total) by mouth every 6 (six) hours as needed for severe pain.  20 tablet  0   .  ibuprofen (ADVIL,MOTRIN) 200 MG tablet  Take 600 mg by mouth every 6 (six) hours as needed for mild pain or moderate pain.        Marland Kitchen  STRATTERA 80 MG capsule  Take 80 mg by mouth daily.    10   .  HYDROmorphone (DILAUDID) 4 MG tablet  Take 1 tablet (4 mg total) by mouth every 4 (four) hours as needed for severe pain.  60 tablet  0      No current facility-administered medications for this visit.      Physical Exam Blood pressure 146/98, height 6' (1.829 m), weight 220 lb (99.791 kg). Physical Exam  The patient is well developed well nourished and well groomed.    Orientation to person place and time is normal    Mood is pleasant.  Ambulatory status he walks in in a shoulder immobilizer without any difficulty  Inspection: Of the left shoulder show swelling tenderness and ecchymosis  ROM: Not tested  Stability: Not tested  Motor exam: The hand and wrist are working normally  Skin:  He most this is noted left upper arm down to the elbow  Neuro: Normal sensation  Vascular: 2+ radial and ulnar pulses  Lymph: Normal lymph nodes  The right shoulder moves normally I did not detect any instability   Data Reviewed Read xrays  pre-and post reduction x-rays show radial tuberosity fracture dislocation left shoulder with good reduction of the glenohumeral joint there appears to be displacement of the greater tuberosity Reports reviewed: X-ray reports of the pre-and postreduction films were reviewed  CT shows displced fracture left gretaer tub Diagnosis     Left gretaer tuberosity fracture   otif left shoulder

## 2015-06-03 NOTE — Interval H&P Note (Signed)
History and Physical Interval Note:  06/03/2015 12:54 PM  George [name]  has presented today for surgery, with the diagnosis of left greater tuberosity fracture  The various methods of treatment have been discussed with the patient and family. After consideration of risks, benefits and other options for treatment, the patient has consented to  Procedure(s) with comments: OPEN REDUCTION INTERNAL FIXATION (ORIF) PROXIMAL HUMERUS FRACTURE (Left) - greater tuberosity fracture as a surgical intervention .  The patient's history has been reviewed, patient examined, no change in status, stable for surgery.  I have reviewed the patient's chart and labs.  Questions were answered to the patient's satisfaction.     Kassy Mcenroe   

## 2015-06-03 NOTE — Anesthesia Postprocedure Evaluation (Signed)
  Anesthesia Post-op Note  Patient: George [name]  Procedure(s) Performed: Procedure(s): OPEN REDUCTION INTERNAL FIXATION LEFT PROXIMAL HUMERUS FRACTURE (Left)  Patient Location: PACU  Anesthesia Type:General  Level of Consciousness: awake, alert , oriented and patient cooperative  Airway and Oxygen Therapy: Patient Spontanous Breathing  Post-op Pain: moderate  Post-op Assessment: Post-op Vital signs reviewed, Patient's Cardiovascular Status Stable, Respiratory Function Stable, Patent Airway, No signs of Nausea or vomiting and Pain level controlled              Post-op Vital Signs: Reviewed and stable  Last Vitals:  Filed Vitals:   06/03/15 1530  BP: 154/92  Pulse: 101  Temp:   Resp: 21    Complications: No apparent anesthesia complications

## 2015-06-06 ENCOUNTER — Encounter: Payer: Self-pay | Admitting: Orthopedic Surgery

## 2015-06-06 ENCOUNTER — Ambulatory Visit (INDEPENDENT_AMBULATORY_CARE_PROVIDER_SITE_OTHER): Payer: BLUE CROSS/BLUE SHIELD | Admitting: Orthopedic Surgery

## 2015-06-06 VITALS — BP 129/92 | Ht 72.0 in | Wt 222.0 lb

## 2015-06-06 DIAGNOSIS — Z4789 Encounter for other orthopedic aftercare: Secondary | ICD-10-CM

## 2015-06-06 MED ORDER — OXYCODONE-ACETAMINOPHEN 5-325 MG PO TABS: 1.0000 | ORAL_TABLET | ORAL | Status: DC | PRN

## 2015-06-06 NOTE — Patient Instructions (Signed)
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

## 2015-06-06 NOTE — Progress Notes (Signed)
George [name] is postop day #3 status post open treatment internal fixation left shoulder for greater tuberosity fracture status post fracture dislocation  S:   Chief Complaint  Patient presents with  . Follow-up    post op 1, left shoulder OTIF, DOS 06/03/15   Doing well.   The patient complains of  moderate discomfort  The incision is clean dry and intact with minimal drainage.  The dressing was changed.  Current pain medication: Percocet and ibuprofen  Patient to return in less than 2 weeks to remove the staples and to get an x-ray of his left shoulder  He is not to use his left arm his arms to stand shoulder sling and harness until further notice  Patient is to return as needed for redness, swelling, discomfort, or any concern about his  surgery.

## 2015-06-16 ENCOUNTER — Ambulatory Visit (INDEPENDENT_AMBULATORY_CARE_PROVIDER_SITE_OTHER): Payer: Self-pay | Admitting: Orthopedic Surgery

## 2015-06-16 ENCOUNTER — Ambulatory Visit (INDEPENDENT_AMBULATORY_CARE_PROVIDER_SITE_OTHER): Payer: BLUE CROSS/BLUE SHIELD

## 2015-06-16 VITALS — BP 135/90 | Ht 72.0 in | Wt 222.0 lb

## 2015-06-16 DIAGNOSIS — S42202D Unspecified fracture of upper end of left humerus, subsequent encounter for fracture with routine healing: Secondary | ICD-10-CM

## 2015-06-16 MED ORDER — OXYCODONE-ACETAMINOPHEN 5-325 MG PO TABS: 1.0000 | ORAL_TABLET | ORAL | Status: DC | PRN

## 2015-06-16 NOTE — Patient Instructions (Signed)
Sling 4 more weeks, remove to bathe, keep arm close to body  Call [facility]  Therapy dept to schedule therapy visits

## 2015-06-16 NOTE — Progress Notes (Signed)
Chief Complaint  Patient presents with  . Follow-up    10 day follow up + xray left shoulder + staple removal    Staple removed with ease. Wound looks clean. X-rays show most of the fracture fragments have been reduced there is 1 rather large posterior fragment which shows slight displacement. We will start occupational therapy and see how he does. We may need to do a postop CT scan to evaluate all the fracture fragments angulate 3-D view just to make sure we get a good look at that one fragment.  He can remove his sling for bathing. He otherwise is to keep it on. I refilled his Percocet started the weaning process. Follow-up 2 weeks get a refill on his medicine. Sling where another 4 weeks

## 2015-06-28 ENCOUNTER — Encounter: Payer: Self-pay | Admitting: Orthopedic Surgery

## 2015-06-28 ENCOUNTER — Ambulatory Visit (INDEPENDENT_AMBULATORY_CARE_PROVIDER_SITE_OTHER): Payer: Self-pay | Admitting: Orthopedic Surgery

## 2015-06-28 VITALS — BP 128/96 | Ht 72.0 in | Wt 222.0 lb

## 2015-06-28 DIAGNOSIS — Z4789 Encounter for other orthopedic aftercare: Secondary | ICD-10-CM

## 2015-06-28 DIAGNOSIS — S42202D Unspecified fracture of upper end of left humerus, subsequent encounter for fracture with routine healing: Secondary | ICD-10-CM

## 2015-06-28 MED ORDER — HYDROCODONE-ACETAMINOPHEN 7.5-325 MG PO TABS: 1.0000 | ORAL_TABLET | Freq: Four times a day (QID) | ORAL | Status: DC | PRN

## 2015-06-28 NOTE — Patient Instructions (Signed)
Ok to remove sling in 3 days, then wear as needed

## 2015-06-28 NOTE — Progress Notes (Signed)
Patient ID: George [name], male   DOB: May 05, 1991, 24 y.o.   MRN: [id number]  Follow up visit  Chief Complaint  Patient presents with  . Follow-up    2 week follow up left shoulder, DOS 06/03/15    BP 128/96 mmHg  Ht 6' (1.829 m)  Wt 222 lb (100.699 kg)  BMI 30.10 kg/m2  No diagnosis found.  Status post open treatment internal fixation greater tuberosity fracture left humerus  Patient in a sling for 4 weeks to be completed this weekend  Start occupational therapy tomorrow  Follow-up with me in about 4 weeks  Meds ordered this encounter  Medications  . HYDROcodone-acetaminophen (NORCO) 7.5-325 MG per tablet    Sig: Take 1 tablet by mouth every 6 (six) hours as needed for moderate pain.    Dispense:  56 tablet    Refill:  0

## 2015-06-30 ENCOUNTER — Ambulatory Visit: Payer: BLUE CROSS/BLUE SHIELD | Admitting: Orthopedic Surgery

## 2015-07-06 ENCOUNTER — Encounter (HOSPITAL_COMMUNITY): Payer: Self-pay

## 2015-07-06 ENCOUNTER — Ambulatory Visit (HOSPITAL_COMMUNITY): Payer: BLUE CROSS/BLUE SHIELD | Attending: Orthopedic Surgery

## 2015-07-06 DIAGNOSIS — M25512 Pain in left shoulder: Secondary | ICD-10-CM | POA: Diagnosis present

## 2015-07-06 DIAGNOSIS — R29898 Other symptoms and signs involving the musculoskeletal system: Secondary | ICD-10-CM | POA: Diagnosis present

## 2015-07-06 DIAGNOSIS — M629 Disorder of muscle, unspecified: Secondary | ICD-10-CM | POA: Diagnosis present

## 2015-07-06 DIAGNOSIS — M25612 Stiffness of left shoulder, not elsewhere classified: Secondary | ICD-10-CM | POA: Diagnosis present

## 2015-07-06 DIAGNOSIS — Z9889 Other specified postprocedural states: Secondary | ICD-10-CM | POA: Insufficient documentation

## 2015-07-06 DIAGNOSIS — M6289 Other specified disorders of muscle: Secondary | ICD-10-CM

## 2015-07-06 NOTE — Therapy (Signed)
[facility] [address] [place], [place], [address] Phone: [phone]   Fax:  [phone]  Occupational Therapy Evaluation  Patient Details  Name: George [name] MRN: [id number] Date of Birth: May 18, 1991 Referring Provider:  Vickki Hearing, MD  Encounter Date: 07/06/2015      OT End of Session - 07/06/15 1243    Visit Number 1   Number of Visits 12   Date for OT Re-Evaluation 09/04/15  mini reassess: 08/03/15   Authorization Type BCBS - 25 visit limit. 0 used.   Authorization - Visit Number 1   Authorization - Number of Visits 25   OT Start Time 1017   OT Stop Time 1100   OT Time Calculation (min) 43 min   Activity Tolerance Patient tolerated treatment well   Behavior During Therapy WFL for tasks assessed/performed      Past Medical History  Diagnosis Date  . Depressed   . ADHD (attention deficit hyperactivity disorder)   . Dislocation of left shoulder joint   . Fracture of humeral head, left, closed     Past Surgical History  Procedure Laterality Date  . Orif humerus fracture Left 06/03/2015    Procedure: OPEN REDUCTION INTERNAL FIXATION LEFT PROXIMAL HUMERUS FRACTURE;  Surgeon: Vickki Hearing, MD;  Location: [facility];  Service: Orthopedics;  Laterality: Left;    There were no vitals filed for this visit.  Visit Diagnosis:  S/P shoulder surgery - Plan: Ot plan of care cert/re-cert  Pain in joint, shoulder region, left - Plan: Ot plan of care cert/re-cert  Tight fascia - Plan: Ot plan of care cert/re-cert  Shoulder stiffness, left - Plan: Ot plan of care cert/re-cert  Shoulder weakness - Plan: Ot plan of care cert/re-cert      Subjective Assessment - 07/06/15 1226    Subjective  S: I just want my arm to be mobile again.    Pertinent History Patient is a 24 y/o male S/P open treatment internal fixation great tuberosity fx of Left humerus. Fx occured on 05/17/15 when patient was at court. Patient was in  court and does not remember details except he recalls being anxious and frustrated about having to give a urine drug screen. The next thing he knew he was surrounded by officers in handcuffs. He is unsure if he passed out. Surgery was completed on 06/03/15.  Dr. Romeo Apple has referred patient to occupational therapy for evaluation and treatment.    Special Tests FOTO score: 29/100   Patient Stated Goals To be able to use his arm normally.    Currently in Pain? Yes   Pain Score 7    Pain Location Arm   Pain Orientation Left   Pain Descriptors / Indicators Aching   Pain Type Acute pain           [facility] OT Assessment - 07/06/15 1021    Assessment   Diagnosis S/P left humerus internal fixation device   Onset Date 06/03/15   Assessment 07/26/15 Romeo Apple   Prior Therapy None   Precautions   Precautions Shoulder   Type of Shoulder Precautions No lifting at this time.   Restrictions   Weight Bearing Restrictions Yes   Balance Screen   Has the patient fallen in the past 6 months No   Home  Environment   Family/patient expects to be discharged to: Private residence   Living Arrangements Parent   Lives With Family   Prior Function   Level of Independence  Independent   Vocation Unemployed   ADL   ADL comments Difficulty flushing toilet, unable to raise arm up to open cabinets, unable to complete any activity above waist level    Mobility   Mobility Status Independent   Written Expression   Dominant Hand Right   Cognition   Overall Cognitive Status Within Functional Limits for tasks assessed   ROM / Strength   AROM / PROM / Strength AROM;PROM;Strength   Palpation   Palpation comment Max fascial restrictions in left upper arm, trapezius, and scapularis region.    AROM   Overall AROM Comments Assessed supine. ir/ER adducted.   AROM Assessment Site Shoulder   Right/Left Shoulder Left   Left Shoulder Flexion 35 Degrees   Left Shoulder ABduction 57 Degrees   Left Shoulder Internal  Rotation 90 Degrees   Left Shoulder External Rotation 0 Degrees   PROM   Overall PROM Comments Assessed supine. ir/ER adducted.   PROM Assessment Site Shoulder   Right/Left Shoulder Left   Left Shoulder Flexion 90 Degrees   Left Shoulder ABduction 85 Degrees   Left Shoulder Internal Rotation 90 Degrees   Left Shoulder External Rotation 25 Degrees   Strength   Overall Strength Unable to assess;Due to precautions;Due to pain   Strength Assessment Site Shoulder   Right/Left Shoulder Left                         OT Education - 07/06/15 1243    Education provided Yes   Education Details table slides and pendulum exercises.    Person(s) Educated Patient   Methods Explanation;Demonstration;Handout   Comprehension Returned demonstration;Verbalized understanding          OT Short Term Goals - 07/06/15 1320    OT SHORT TERM GOAL #1   Title Paitent will be educated and independent with HEP.   Time 3   Period Weeks   Status New   OT SHORT TERM GOAL #2   Title Patient will increase PROM to [facility] to increase ability to flush the toilet.    Time 3   Period Weeks   Status New   OT SHORT TERM GOAL #3   Title Patient will increase strength in LUE to 3/5 to increase ability to reach up to overhead cabinets with less difficulty.    Time 3   Period Weeks   Status New   OT SHORT TERM GOAL #4   Title Patient will decrease pain during daily tasks to 5/10.   Time 3   Period Weeks   Status New   OT SHORT TERM GOAL #5   Title Patient will decrease fascial restrictions to mod amount.    Time 3   Period Weeks   Status New           OT Long Term Goals - 07/06/15 1322    OT LONG TERM GOAL #1   Title patient will return to highest level of independence with all daily and leisure activties.    Time 6   Period Weeks   Status New   OT LONG TERM GOAL #2   Title Patient will increase LUE strength to 4/5 to increase ability to complete all daily activities with LUE as non  dominant.    Time 6   Period Weeks   Status New   OT LONG TERM GOAL #3   Title Patient will increase AROM to [facility] to increase ability to complete activities above shoulder height.  Time 6   Period Weeks   Status New   OT LONG TERM GOAL #4   Title Patient will decrease fascial restrictions to min amount.    Time 6   Period Weeks   Status New   OT LONG TERM GOAL #5   Title Patient will decrease pain level during daily tasks to 3/10 or less.    Time 6   Period Weeks   Status New               Plan - 07/06/15 1246    Clinical Impression Statement A: Patient is a 24 y/o male S/P greater tuberosity repair of left humerus causing increased pain and fascial restrictions and decreased strength and ROM resulting in difficulty completing all daily activities with LUE.    Pt will benefit from skilled therapeutic intervention in order to improve on the following deficits (Retired) Decreased strength;Pain;Impaired UE functional use;Increased edema;Increased fascial restricitons;Decreased range of motion   Rehab Potential Excellent   OT Frequency 2x / week   OT Duration 6 weeks   OT Treatment/Interventions Self-care/ADL training;Moist Heat;Electrical Stimulation;Cryotherapy;Ultrasound;Passive range of motion;Patient/family education;Therapeutic activities;Therapeutic exercises;Manual Therapy   Plan P: Patient will benefit from skilled OT services to increase functional performance using LUE as nondominant task for daily tasks. Treatment Plan: Education on self myofascial release, myofascial release, muscle energy technique, manual stretching, P/ROM, AA/ROM, A/ROM, and general strengthening. Begin with P/ROM exercises and progress to AA/ROM as patient is able.    Consulted and Agree with Plan of Care Patient        Problem List Patient Active Problem List   Diagnosis Date Noted  . Fracture of greater tuberosity of left humerus   . CLOSED FRACTURE OF HEAD OF RADIUS 02/24/2008     Limmie Patricia, OTR/L,CBIS  [phone]  07/06/2015, 1:30 PM  [facility] [address] [place], [place], [address] Phone: [phone]   Fax:  [phone]

## 2015-07-06 NOTE — Patient Instructions (Signed)
TOWEL SLIDES COMPLETE FOR 1-3 MINUTES, 3-5 TIMES PER DAY  SHOULDER: Flexion On Table   Place hands on table, elbows straight. Move hips away from body. Press hands down into table. Hold ___ seconds. ___ reps per set, ___ sets per day, ___ days per week  Abduction (Passive)   With arm out to side, resting on table, lower head toward arm, keeping trunk away from table. Hold ____ seconds. Repeat ____ times. Do ____ sessions per day.  Copyright  VHI. All rights reserved.     Internal Rotation (Assistive)   Seated with elbow bent at right angle and held against side, slide arm on table surface in an inward arc. Repeat ____ times. Do ____ sessions per day. Activity: Use this motion to brush crumbs off the table.  Copyright  VHI. All rights reserved.    COMPLETE PENDULUM EXERCISES FOR 30 SECONDS TO A MINUTE EACH, 3-5 TIMES PER DAY. ROM: Pendulum (Side-to-Side)   Deje que el brazo derecho se balancee suavemente de lado a lado mientras oscila el cuerpo de [place]. Repita ____ veces por rutina. Realice ____ rutinas por sesin. Realice ____ sesiones por da.  http://orth.exer.us/792   Copyright  VHI. All rights reserved.  Pendulum Forward/Back   Bend forward 90 at waist, using table for support. Rock body forward and back to swing arm. Repeat ____ times. Do ____ sessions per day.  Copyright  VHI. All rights reserved.  Pendulum Circular   Bend forward 90 at waist, leaning on table for support. Rock body in a circular pattern to move arm clockwise ____ times then counterclockwise ____ times. Do ____ sessions per day.  Copyright  VHI. All rights reserved.  AROM: Wrist Extension

## 2015-07-08 ENCOUNTER — Encounter (HOSPITAL_COMMUNITY): Payer: BLUE CROSS/BLUE SHIELD | Admitting: Occupational Therapy

## 2015-07-12 ENCOUNTER — Ambulatory Visit (HOSPITAL_COMMUNITY): Payer: BLUE CROSS/BLUE SHIELD

## 2015-07-12 ENCOUNTER — Encounter (HOSPITAL_COMMUNITY): Payer: Self-pay

## 2015-07-12 DIAGNOSIS — M25612 Stiffness of left shoulder, not elsewhere classified: Secondary | ICD-10-CM

## 2015-07-12 DIAGNOSIS — M6289 Other specified disorders of muscle: Secondary | ICD-10-CM

## 2015-07-12 DIAGNOSIS — Z9889 Other specified postprocedural states: Secondary | ICD-10-CM | POA: Diagnosis not present

## 2015-07-12 DIAGNOSIS — M629 Disorder of muscle, unspecified: Secondary | ICD-10-CM

## 2015-07-12 DIAGNOSIS — R29898 Other symptoms and signs involving the musculoskeletal system: Secondary | ICD-10-CM

## 2015-07-12 DIAGNOSIS — M25512 Pain in left shoulder: Secondary | ICD-10-CM

## 2015-07-12 NOTE — Therapy (Signed)
[facility] [address] [place], [place], [address] Phone: [phone]   Fax:  [phone]  Occupational Therapy Treatment  Patient Details  Name: George [name] MRN: [id number] Date of Birth: 1990/11/21 Referring Provider:  Vickki Hearing, MD  Encounter Date: 07/12/2015      OT End of Session - 07/12/15 1048    Visit Number 2   Number of Visits 12   Date for OT Re-Evaluation 09/04/15  mini reassess: 08/03/15   Authorization Type BCBS - 25 visit limit. 0 used.   Authorization - Visit Number 2   Authorization - Number of Visits 25   OT Start Time [phone]   OT Stop Time 1015   OT Time Calculation (min) 33 min   Activity Tolerance Patient tolerated treatment well   Behavior During Therapy WFL for tasks assessed/performed      Past Medical History  Diagnosis Date  . Depressed   . ADHD (attention deficit hyperactivity disorder)   . Dislocation of left shoulder joint   . Fracture of humeral head, left, closed     Past Surgical History  Procedure Laterality Date  . Orif humerus fracture Left 06/03/2015    Procedure: OPEN REDUCTION INTERNAL FIXATION LEFT PROXIMAL HUMERUS FRACTURE;  Surgeon: Vickki Hearing, MD;  Location: [facility];  Service: Orthopedics;  Laterality: Left;    There were no vitals filed for this visit.  Visit Diagnosis:  Pain in joint, shoulder region, left  Tight fascia  Shoulder stiffness, left  Shoulder weakness      Subjective Assessment - 07/12/15 1002    Subjective  S: It feels really stiff today. I woke up at 5:30 this morning in pain and I couldn't go back to sleep.   Currently in Pain? Yes   Pain Score 7    Pain Location Arm   Pain Orientation Left   Pain Descriptors / Indicators Aching   Pain Type Acute pain            [facility] OT Assessment - 07/12/15 1004    Assessment   Diagnosis S/P left humerus internal fixation device   Precautions   Precautions Shoulder   Type of  Shoulder Precautions No lifting at this time.                  OT Treatments/Exercises (OP) - 07/12/15 1005    Exercises   Exercises Shoulder   Shoulder Exercises: Supine   Protraction PROM;10 reps   Horizontal ABduction PROM;10 reps   External Rotation PROM;10 reps   Internal Rotation PROM;10 reps   Flexion PROM;10 reps   ABduction PROM;10 reps   Shoulder Exercises: Seated   Elevation AROM;10 reps   Extension AROM;10 reps   Row AROM;10 reps   Shoulder Exercises: Therapy Ball   Flexion 10 reps   ABduction 10 reps   Shoulder Exercises: ROM/Strengthening   Thumb Tacks 1'  with 3 rest breaks   Manual Therapy   Manual Therapy Myofascial release   Myofascial Release Myofascial release and manual stretching to left upper arm, trapezius, and scapularis region to decrease fascial restrictions and increase joint mobility in a pain free zone.                   OT Short Term Goals - 07/12/15 1003    OT SHORT TERM GOAL #1   Title Paitent will be educated and independent with HEP.   Time 3   Period Weeks   Status  On-going   OT SHORT TERM GOAL #2   Title Patient will increase PROM to [facility] to increase ability to flush the toilet.    Time 3   Period Weeks   Status On-going   OT SHORT TERM GOAL #3   Title Patient will increase strength in LUE to 3/5 to increase ability to reach up to overhead cabinets with less difficulty.    Time 3   Period Weeks   Status On-going   OT SHORT TERM GOAL #4   Title Patient will decrease pain during daily tasks to 5/10.   Time 3   Period Weeks   Status On-going   OT SHORT TERM GOAL #5   Title Patient will decrease fascial restrictions to mod amount.    Time 3   Period Weeks   Status On-going           OT Long Term Goals - 07/12/15 1003    OT LONG TERM GOAL #1   Title patient will return to highest level of independence with all daily and leisure activties.    Time 6   Period Weeks   Status On-going   OT LONG TERM  GOAL #2   Title Patient will increase LUE strength to 4/5 to increase ability to complete all daily activities with LUE as non dominant.    Time 6   Period Weeks   Status On-going   OT LONG TERM GOAL #3   Title Patient will increase AROM to [facility] to increase ability to complete activities above shoulder height.    Time 6   Period Weeks   Status On-going   OT LONG TERM GOAL #4   Title Patient will decrease fascial restrictions to min amount.    Time 6   Period Weeks   Status On-going   OT LONG TERM GOAL #5   Title Patient will decrease pain level during daily tasks to 3/10 or less.    Time 6   Period Weeks   Status On-going               Plan - 07/12/15 1049    Clinical Impression Statement A: Initiated myofascial release, manual stretching, and seated A/ROM scapular exercises. Pt with increased fascial restrictions and joint limitations at beginning of session. Pt had great response to myofascial release technique.    Plan P: Give OT evaluation and review goals. Add isometric exercises and shoulder glides.         Problem List Patient Active Problem List   Diagnosis Date Noted  . Fracture of greater tuberosity of left humerus   . CLOSED FRACTURE OF HEAD OF RADIUS 02/24/2008    George [name], OTR/L,CBIS  [phone]  07/12/2015, 10:52 AM  [facility] [address] [place], [place], [address] Phone: [phone]   Fax:  [phone]

## 2015-07-14 ENCOUNTER — Telehealth: Payer: Self-pay | Admitting: Orthopedic Surgery

## 2015-07-14 ENCOUNTER — Other Ambulatory Visit: Payer: Self-pay | Admitting: *Deleted

## 2015-07-14 MED ORDER — HYDROCODONE-ACETAMINOPHEN 7.5-325 MG PO TABS: 1.0000 | ORAL_TABLET | Freq: Four times a day (QID) | ORAL | Status: DC | PRN

## 2015-07-14 NOTE — Telephone Encounter (Signed)
Patient states he has had 2 physical therapy appointments and he is requesting a refill on HYDROcodone-acetaminophen (NORCO) 7.5-325 MG per tablet please advise?

## 2015-07-15 ENCOUNTER — Ambulatory Visit (HOSPITAL_COMMUNITY): Payer: BLUE CROSS/BLUE SHIELD

## 2015-07-15 ENCOUNTER — Telehealth: Payer: Self-pay | Admitting: Orthopedic Surgery

## 2015-07-15 NOTE — Telephone Encounter (Signed)
Patient picked up Rx

## 2015-07-15 NOTE — Telephone Encounter (Signed)
Patient canceled his appt ,he dont have transportation.

## 2015-07-20 ENCOUNTER — Encounter (HOSPITAL_COMMUNITY): Payer: Self-pay | Admitting: Occupational Therapy

## 2015-07-20 ENCOUNTER — Ambulatory Visit (HOSPITAL_COMMUNITY): Payer: BLUE CROSS/BLUE SHIELD | Attending: Orthopedic Surgery | Admitting: Occupational Therapy

## 2015-07-20 DIAGNOSIS — R29898 Other symptoms and signs involving the musculoskeletal system: Secondary | ICD-10-CM

## 2015-07-20 DIAGNOSIS — M629 Disorder of muscle, unspecified: Secondary | ICD-10-CM | POA: Diagnosis present

## 2015-07-20 DIAGNOSIS — M25512 Pain in left shoulder: Secondary | ICD-10-CM

## 2015-07-20 DIAGNOSIS — M25612 Stiffness of left shoulder, not elsewhere classified: Secondary | ICD-10-CM | POA: Diagnosis present

## 2015-07-20 DIAGNOSIS — M6289 Other specified disorders of muscle: Secondary | ICD-10-CM

## 2015-07-20 NOTE — Patient Instructions (Signed)
1) Seated Row   Sit up straight with elbows by your sides. Pull back with shoulders/elbows, keeping forearms straight, as if pulling back on the reins of a horse. Squeeze shoulder blades together. Repeat _10-12__times, __2__sets/day    2) Shoulder Elevation    Sit up straight with arms by your sides. Slowly bring your shoulders up towards your ears. Repeat_10-12__times, __2__ sets/day    3) Shoulder Extension    Sit up straight with both arms by your side, draw your arms back behind your waist. Keep your elbows straight. Repeat _10-12___times, ___2_sets/day.

## 2015-07-20 NOTE — Therapy (Signed)
[facility] [address] [place], [place], [address] Phone: [phone]   Fax:  [phone]  Occupational Therapy Treatment  Patient Details  Name: George [name] MRN: [id number] Date of Birth: [birth year] Referring Provider:  Vickki Hearing, MD  Encounter Date: 07/20/2015      OT End of Session - 07/20/15 1151    Visit Number 3   Number of Visits 12   Date for OT Re-Evaluation 09/04/15  mini reassess: 08/03/15   Authorization Type BCBS - 25 visit limit. 0 used.   Authorization - Visit Number 3   Authorization - Number of Visits 25   OT Start Time 1107   OT Stop Time 1149   OT Time Calculation (min) 42 min   Activity Tolerance Patient tolerated treatment well   Behavior During Therapy WFL for tasks assessed/performed      Past Medical History  Diagnosis Date  . Depressed   . ADHD (attention deficit hyperactivity disorder)   . Dislocation of left shoulder joint   . Fracture of humeral head, left, closed     Past Surgical History  Procedure Laterality Date  . Orif humerus fracture Left 06/03/2015    Procedure: OPEN REDUCTION INTERNAL FIXATION LEFT PROXIMAL HUMERUS FRACTURE;  Surgeon: Vickki Hearing, MD;  Location: [facility];  Service: Orthopedics;  Laterality: Left;    There were no vitals filed for this visit.  Visit Diagnosis:  Pain in joint, shoulder region, left  Tight fascia  Shoulder stiffness, left  Shoulder weakness      Subjective Assessment - 07/20/15 1111    Subjective  S: I didn't get to take my ibuprofen this morning.    Currently in Pain? Yes   Pain Score 8    Pain Location Shoulder   Pain Orientation Left   Pain Descriptors / Indicators Aching   Pain Type Acute pain            [facility] OT Assessment - 07/20/15 1151    Assessment   Diagnosis S/P left humerus internal fixation device   Precautions   Precautions Shoulder   Type of Shoulder Precautions No lifting at this time.                   OT Treatments/Exercises (OP) - 07/20/15 1113    Exercises   Exercises Shoulder   Shoulder Exercises: Supine   Protraction PROM;10 reps   Horizontal ABduction PROM;10 reps   External Rotation PROM;10 reps   Internal Rotation PROM;10 reps   Flexion PROM;10 reps   ABduction PROM;10 reps   Shoulder Exercises: Seated   Elevation AROM;10 reps   Extension AROM;10 reps   Row AROM;10 reps   Shoulder Exercises: Therapy Ball   Flexion 10 reps   ABduction 10 reps   Shoulder Exercises: ROM/Strengthening   Thumb Tacks 1'  1 rest break; low thumb tacks   Shoulder Exercises: Isometric Strengthening   Flexion Supine;3X3"   Extension Supine;3X3"   External Rotation Supine;3X3"   Internal Rotation Supine;3X3"   ABduction Supine;3X3"   ADduction Supine;3X3"   Manual Therapy   Manual Therapy Myofascial release   Myofascial Release Myofascial release and manual stretching to left upper arm, trapezius, and scapularis region to decrease fascial restrictions and increase joint mobility in a pain free zone.                   OT Short Term Goals - 07/12/15 1003    OT SHORT TERM GOAL #  1   Title Paitent will be educated and independent with HEP.   Time 3   Period Weeks   Status On-going   OT SHORT TERM GOAL #2   Title Patient will increase PROM to [facility] to increase ability to flush the toilet.    Time 3   Period Weeks   Status On-going   OT SHORT TERM GOAL #3   Title Patient will increase strength in LUE to 3/5 to increase ability to reach up to overhead cabinets with less difficulty.    Time 3   Period Weeks   Status On-going   OT SHORT TERM GOAL #4   Title Patient will decrease pain during daily tasks to 5/10.   Time 3   Period Weeks   Status On-going   OT SHORT TERM GOAL #5   Title Patient will decrease fascial restrictions to mod amount.    Time 3   Period Weeks   Status On-going           OT Long Term Goals - 07/12/15 1003    OT LONG TERM  GOAL #1   Title patient will return to highest level of independence with all daily and leisure activties.    Time 6   Period Weeks   Status On-going   OT LONG TERM GOAL #2   Title Patient will increase LUE strength to 4/5 to increase ability to complete all daily activities with LUE as non dominant.    Time 6   Period Weeks   Status On-going   OT LONG TERM GOAL #3   Title Patient will increase AROM to [facility] to increase ability to complete activities above shoulder height.    Time 6   Period Weeks   Status On-going   OT LONG TERM GOAL #4   Title Patient will decrease fascial restrictions to min amount.    Time 6   Period Weeks   Status On-going   OT LONG TERM GOAL #5   Title Patient will decrease pain level during daily tasks to 3/10 or less.    Time 6   Period Weeks   Status On-going               Plan - 07/20/15 1152    Clinical Impression Statement A: Added isometric exercises this session, pt able to complete thumb tacks with only 1 rest break. Provided and reviewed evaluation, and provided seated scapular AROM HEP. Pt reports increased pain at beginning of session due to lack of pain medication this morning.    Plan P: Add shoulder glides, increase AROM and therapy ball repetitions to 15.         Problem List Patient Active Problem List   Diagnosis Date Noted  . Fracture of greater tuberosity of left humerus   . CLOSED FRACTURE OF HEAD OF RADIUS 02/24/2008    Ezra Sites, OTR/L  [phone]  07/20/2015, 11:55 AM  [place] [place] [address] [place], [place], [address] Phone: [phone]   Fax:  [phone]

## 2015-07-21 ENCOUNTER — Encounter (HOSPITAL_COMMUNITY): Payer: Self-pay | Admitting: Occupational Therapy

## 2015-07-21 ENCOUNTER — Ambulatory Visit (HOSPITAL_COMMUNITY): Payer: BLUE CROSS/BLUE SHIELD | Admitting: Occupational Therapy

## 2015-07-21 DIAGNOSIS — M25512 Pain in left shoulder: Secondary | ICD-10-CM | POA: Diagnosis not present

## 2015-07-21 DIAGNOSIS — R29898 Other symptoms and signs involving the musculoskeletal system: Secondary | ICD-10-CM

## 2015-07-21 DIAGNOSIS — M6289 Other specified disorders of muscle: Secondary | ICD-10-CM

## 2015-07-21 DIAGNOSIS — M25612 Stiffness of left shoulder, not elsewhere classified: Secondary | ICD-10-CM

## 2015-07-21 DIAGNOSIS — M629 Disorder of muscle, unspecified: Secondary | ICD-10-CM

## 2015-07-21 NOTE — Therapy (Signed)
[facility] [address] [place], [place], [address] Phone: [phone]   Fax:  [phone]  Occupational Therapy Treatment  Patient Details  Name: George [name] MRN: [id number] Date of Birth: Aug 23, 1991 Referring Provider:  Vickki Hearing, MD  Encounter Date: 07/21/2015      OT End of Session - 07/21/15 1608    Visit Number 4   Number of Visits 12   Date for OT Re-Evaluation 09/04/15  mini reassess: 08/03/15   Authorization Type BCBS - 25 visit limit. 0 used.   Authorization - Visit Number 4   Authorization - Number of Visits 25   OT Start Time 1522   OT Stop Time 1602   OT Time Calculation (min) 40 min   Activity Tolerance Patient tolerated treatment well   Behavior During Therapy WFL for tasks assessed/performed      Past Medical History  Diagnosis Date  . Depressed   . ADHD (attention deficit hyperactivity disorder)   . Dislocation of left shoulder joint   . Fracture of humeral head, left, closed     Past Surgical History  Procedure Laterality Date  . Orif humerus fracture Left 06/03/2015    Procedure: OPEN REDUCTION INTERNAL FIXATION LEFT PROXIMAL HUMERUS FRACTURE;  Surgeon: Vickki Hearing, MD;  Location: [facility];  Service: Orthopedics;  Laterality: Left;    There were no vitals filed for this visit.  Visit Diagnosis:  Pain in joint, shoulder region, left  Tight fascia  Shoulder stiffness, left  Shoulder weakness      Subjective Assessment - 07/21/15 1524    Subjective  S: It seems like when the weather changes it bothers me more.    Currently in Pain? No/denies            [facility] OT Assessment - 07/21/15 1608    Assessment   Diagnosis S/P left humerus internal fixation device   Precautions   Precautions Shoulder   Type of Shoulder Precautions No lifting at this time.                  OT Treatments/Exercises (OP) - 07/21/15 1524    Exercises   Exercises Shoulder   Shoulder  Exercises: Supine   Protraction PROM;10 reps;AAROM;5 reps   Horizontal ABduction PROM;10 reps;AAROM;5 reps   External Rotation PROM;10 reps;AAROM;5 reps   Internal Rotation PROM;10 reps;AAROM;5 reps   Flexion PROM;10 reps;AAROM;5 reps   ABduction PROM;10 reps;AAROM;5 reps   Shoulder Exercises: Seated   Elevation AROM;15 reps   Extension AROM;15 reps   Row AROM;15 reps   Shoulder Exercises: Therapy Ball   Flexion 15 reps   ABduction 15 reps   Shoulder Exercises: ROM/Strengthening   Thumb Tacks 1'  1 rest break   Shoulder Exercises: Isometric Strengthening   Flexion Supine;3X5"   Extension Supine;3X5"   External Rotation Supine;3X5"   Internal Rotation Supine;3X5"   ABduction Supine;3X5"   ADduction Supine;3X5"   Manual Therapy   Manual Therapy Myofascial release   Myofascial Release Myofascial release and manual stretching to left upper arm, trapezius, and scapularis region to decrease fascial restrictions and increase joint mobility in a pain free zone.                   OT Short Term Goals - 07/12/15 1003    OT SHORT TERM GOAL #1   Title Paitent will be educated and independent with HEP.   Time 3   Period Weeks   Status On-going  OT SHORT TERM GOAL #2   Title Patient will increase PROM to [facility] to increase ability to flush the toilet.    Time 3   Period Weeks   Status On-going   OT SHORT TERM GOAL #3   Title Patient will increase strength in LUE to 3/5 to increase ability to reach up to overhead cabinets with less difficulty.    Time 3   Period Weeks   Status On-going   OT SHORT TERM GOAL #4   Title Patient will decrease pain during daily tasks to 5/10.   Time 3   Period Weeks   Status On-going   OT SHORT TERM GOAL #5   Title Patient will decrease fascial restrictions to mod amount.    Time 3   Period Weeks   Status On-going           OT Long Term Goals - 07/12/15 1003    OT LONG TERM GOAL #1   Title patient will return to highest level of  independence with all daily and leisure activties.    Time 6   Period Weeks   Status On-going   OT LONG TERM GOAL #2   Title Patient will increase LUE strength to 4/5 to increase ability to complete all daily activities with LUE as non dominant.    Time 6   Period Weeks   Status On-going   OT LONG TERM GOAL #3   Title Patient will increase AROM to [facility] to increase ability to complete activities above shoulder height.    Time 6   Period Weeks   Status On-going   OT LONG TERM GOAL #4   Title Patient will decrease fascial restrictions to min amount.    Time 6   Period Weeks   Status On-going   OT LONG TERM GOAL #5   Title Patient will decrease pain level during daily tasks to 3/10 or less.    Time 6   Period Weeks   Status On-going               Plan - 07/21/15 1609    Clinical Impression Statement A: Increased isometrics, seated scapular AROM, supine AAROM exercises. Pt reports some soreness in his shoulder today, however says it feels less stiff than yesterday.    Plan P: Increase AAROM supine to 10 repetitions if able to tolerate, attempt to complete thumb tacks with no rest breaks. Continue working to increase PROM to [facility].         Problem List Patient Active Problem List   Diagnosis Date Noted  . Fracture of greater tuberosity of left humerus   . CLOSED FRACTURE OF HEAD OF RADIUS 02/24/2008   Ezra Sites, OTR/L  [phone]  07/21/2015, 4:12 PM  [facility] [address] [place], [place], [address] Phone: [phone]   Fax:  [phone]

## 2015-07-25 ENCOUNTER — Ambulatory Visit (HOSPITAL_COMMUNITY): Payer: BLUE CROSS/BLUE SHIELD | Admitting: Specialist

## 2015-07-26 ENCOUNTER — Ambulatory Visit: Payer: BLUE CROSS/BLUE SHIELD | Admitting: Orthopedic Surgery

## 2015-07-27 ENCOUNTER — Encounter (HOSPITAL_COMMUNITY): Payer: BLUE CROSS/BLUE SHIELD

## 2015-07-29 ENCOUNTER — Ambulatory Visit (HOSPITAL_COMMUNITY): Payer: BLUE CROSS/BLUE SHIELD | Admitting: Occupational Therapy

## 2015-08-03 ENCOUNTER — Encounter (HOSPITAL_COMMUNITY): Payer: Self-pay | Admitting: Occupational Therapy

## 2015-08-03 ENCOUNTER — Ambulatory Visit (HOSPITAL_COMMUNITY): Payer: BLUE CROSS/BLUE SHIELD | Admitting: Occupational Therapy

## 2015-08-03 DIAGNOSIS — R29898 Other symptoms and signs involving the musculoskeletal system: Secondary | ICD-10-CM

## 2015-08-03 DIAGNOSIS — M6289 Other specified disorders of muscle: Secondary | ICD-10-CM

## 2015-08-03 DIAGNOSIS — M25612 Stiffness of left shoulder, not elsewhere classified: Secondary | ICD-10-CM

## 2015-08-03 DIAGNOSIS — M629 Disorder of muscle, unspecified: Secondary | ICD-10-CM

## 2015-08-03 DIAGNOSIS — M25512 Pain in left shoulder: Secondary | ICD-10-CM

## 2015-08-03 NOTE — Therapy (Signed)
[facility] [address] [place], [place], [address] Phone: [phone]   Fax:  [phone]  Occupational Therapy Treatment  Patient Details  Name: George [name] MRN: [id number] Date of Birth: 06/13/1991 No Data Recorded  Encounter Date: 08/03/2015      OT End of Session - 08/03/15 1123    Visit Number 5   Number of Visits 12   Date for OT Re-Evaluation 09/04/15  mini reassess: 08/03/15   Authorization Type BCBS - 25 visit limit. 0 used.   Authorization - Visit Number 5   Authorization - Number of Visits 25   OT Start Time 1024   OT Stop Time 1101   OT Time Calculation (min) 37 min   Activity Tolerance Patient tolerated treatment well   Behavior During Therapy WFL for tasks assessed/performed      Past Medical History  Diagnosis Date  . Depressed   . ADHD (attention deficit hyperactivity disorder)   . Dislocation of left shoulder joint   . Fracture of humeral head, left, closed     Past Surgical History  Procedure Laterality Date  . Orif humerus fracture Left 06/03/2015    Procedure: OPEN REDUCTION INTERNAL FIXATION LEFT PROXIMAL HUMERUS FRACTURE;  Surgeon: Vickki HearingStanley E Harrison, MD;  Location: [facility];  Service: Orthopedics;  Laterality: Left;    There were no vitals filed for this visit.  Visit Diagnosis:  Pain in joint, shoulder region, left  Tight fascia  Shoulder stiffness, left  Shoulder weakness      Subjective Assessment - 08/03/15 1025    Subjective  S: I just recently got up so it's still tight this morning.    Currently in Pain? No/denies            [facility] - 08/03/15 1121    Assessment   Diagnosis S/P left humerus internal fixation device   Precautions   Precautions Shoulder   Type of Shoulder Precautions No lifting at this time.                  OT Treatments/Exercises (OP) - 08/03/15 1026    Exercises   Exercises Shoulder   Shoulder Exercises: Supine   Protraction PROM;AAROM;10 reps   Horizontal ABduction PROM;AAROM;10 reps   External Rotation PROM;AAROM;10 reps   Internal Rotation PROM;AAROM;10 reps   Flexion PROM;AAROM;10 reps   ABduction PROM;AAROM;10 reps   Shoulder Exercises: Standing   Protraction AAROM;5 reps   Horizontal ABduction AAROM;5 reps   External Rotation AAROM;5 reps   Internal Rotation AAROM;5 reps   Flexion AAROM;5 reps   ABduction AAROM;5 reps   Shoulder Exercises: ROM/Strengthening   Wall Wash 1'   Thumb Tacks 1'   Manual Therapy   Manual Therapy Myofascial release   Myofascial Release Myofascial release and manual stretching to left upper arm, trapezius, and scapularis region to decrease fascial restrictions and increase joint mobility in a pain free zone.                   OT Short Term Goals - 07/12/15 1003    OT SHORT TERM GOAL #1   Title Paitent will be educated and independent with HEP.   Time 3   Period Weeks   Status On-going   OT SHORT TERM GOAL #2   Title Patient will [facility] to increase ability to flush the toilet.    Time 3   Period Weeks   Status On-going   OT SHORT  TERM GOAL #3   Title Patient will increase strength in LUE to 3/5 to increase ability to reach up to overhead cabinets with less difficulty.    Time 3   Period Weeks   Status On-going   OT SHORT TERM GOAL #4   Title Patient will decrease pain during daily tasks to 5/10.   Time 3   Period Weeks   Status On-going   OT SHORT TERM GOAL #5   Title Patient will decrease fascial restrictions to mod amount.    Time 3   Period Weeks   Status On-going           OT Long Term Goals - 07/12/15 1003    OT LONG TERM GOAL #1   Title patient will return to highest level of independence with all daily and leisure activties.    Time 6   Period Weeks   Status On-going   OT LONG TERM GOAL #2   Title Patient will increase LUE strength to 4/5 to increase ability to complete all daily activities with LUE as  non dominant.    Time 6   Period Weeks   Status On-going   OT LONG TERM GOAL #3   Title Patient will increase AROM to [facility] to increase ability to complete activities above shoulder height.    Time 6   Period Weeks   Status On-going   OT LONG TERM GOAL #4   Title Patient will decrease fascial restrictions to min amount.    Time 6   Period Weeks   Status On-going   OT LONG TERM GOAL #5   Title Patient will decrease pain level during daily tasks to 3/10 or less.    Time 6   Period Weeks   Status On-going               Plan - 08/03/15 1124    Clinical Impression Statement A: Increased supine AA/ROM repetitions to 10, added standing AA/ROM exercises and wall wash. Pt able to complete thumb tacks with no rest breaks today.    Plan P: Mini reassessment. Continue to work on increasing PROM to [facility], update HEP for AA/ROM exercises.         Problem List Patient Active Problem List   Diagnosis Date Noted  . Fracture of greater tuberosity of left humerus   . CLOSED FRACTURE OF HEAD OF RADIUS 02/24/2008    Ezra Sites, OTR/L  [phone] 08/03/2015, 11:28 AM  [facility] [address] [place], [place], [address] Phone: [phone]   Fax:  [phone]  Name: George [name] MRN: [id number] Date of Birth: September 28, 1991

## 2015-08-04 ENCOUNTER — Telehealth (HOSPITAL_COMMUNITY): Payer: Self-pay

## 2015-08-04 ENCOUNTER — Ambulatory Visit (HOSPITAL_COMMUNITY): Payer: BLUE CROSS/BLUE SHIELD | Admitting: Occupational Therapy

## 2015-08-04 ENCOUNTER — Ambulatory Visit (INDEPENDENT_AMBULATORY_CARE_PROVIDER_SITE_OTHER): Payer: Self-pay | Admitting: Orthopedic Surgery

## 2015-08-04 DIAGNOSIS — S42202D Unspecified fracture of upper end of left humerus, subsequent encounter for fracture with routine healing: Secondary | ICD-10-CM

## 2015-08-04 DIAGNOSIS — Z4789 Encounter for other orthopedic aftercare: Secondary | ICD-10-CM

## 2015-08-04 MED ORDER — HYDROCODONE-ACETAMINOPHEN 7.5-325 MG PO TABS: 1.0000 | ORAL_TABLET | ORAL | Status: DC | PRN

## 2015-08-04 NOTE — Telephone Encounter (Signed)
Pt stated he was in too much pain and cancelled today's apt., pt reminded next apt date and time.    136 Berkshire LaneCasey Coletta Lockner, LPTA; CBIS [phone]

## 2015-08-04 NOTE — Progress Notes (Signed)
Surgery August left shoulder greater tuberosity repair  Patient presents for evaluation. PT is going fairly well he did miss a visitor to secondary to being sick  He says his pain was well controlled that he ran out of his pain medicine His current medications are Lexapro and Strattera Norco 7.5His motion is 20 external rotation with his arm is at his side and passive range of motion of 90 abduction and flexion his active range of motion is 90 flexion and the scapular plane  Recommend continue occupational therapy follow-up in 4 weeks continue Norco 7.5 one every 4 #84  Meds ordered this encounter  Medications  . HYDROcodone-acetaminophen (NORCO) 7.5-325 MG tablet    Sig: Take 1 tablet by mouth every 4 (four) hours as needed for moderate pain.    Dispense:  84 tablet    Refill:  0

## 2015-08-09 ENCOUNTER — Ambulatory Visit (HOSPITAL_COMMUNITY): Payer: BLUE CROSS/BLUE SHIELD

## 2015-08-09 ENCOUNTER — Telehealth (HOSPITAL_COMMUNITY): Payer: Self-pay | Admitting: Physical Therapy

## 2015-08-09 ENCOUNTER — Ambulatory Visit: Payer: BLUE CROSS/BLUE SHIELD | Admitting: Orthopedic Surgery

## 2015-08-09 NOTE — Telephone Encounter (Signed)
Patient's mother called to confirm time of patient's appointment on Friday. Educated on time of this appointment.  Nedra HaiKristen Brevyn Ring PT, DPT [phone]

## 2015-08-12 ENCOUNTER — Ambulatory Visit (HOSPITAL_COMMUNITY): Payer: BLUE CROSS/BLUE SHIELD

## 2015-08-12 ENCOUNTER — Telehealth (HOSPITAL_COMMUNITY): Payer: Self-pay

## 2015-08-12 DIAGNOSIS — R29898 Other symptoms and signs involving the musculoskeletal system: Secondary | ICD-10-CM

## 2015-08-12 DIAGNOSIS — M25512 Pain in left shoulder: Secondary | ICD-10-CM | POA: Diagnosis not present

## 2015-08-12 DIAGNOSIS — M629 Disorder of muscle, unspecified: Secondary | ICD-10-CM

## 2015-08-12 DIAGNOSIS — M25612 Stiffness of left shoulder, not elsewhere classified: Secondary | ICD-10-CM

## 2015-08-12 DIAGNOSIS — M6289 Other specified disorders of muscle: Secondary | ICD-10-CM

## 2015-08-12 NOTE — Telephone Encounter (Signed)
Mother called to say they are unavailable for these visits

## 2015-08-12 NOTE — Therapy (Addendum)
[facility] [address] [place], [place], [address] Phone: [phone]   Fax:  [phone]  Occupational Therapy Treatment and mini reassessment  Patient Details  Name: George [name] MRN: [id number] Date of Birth: 1990-12-02 Referring Provider: Fuller CanadaStanley Harrison  Encounter Date: 08/12/2015      OT End of Session - 08/12/15 1105    Visit Number 6   Number of Visits 12   Date for OT Re-Evaluation 09/04/15  mini reassess: 08/03/15   Authorization Type BCBS - 25 visit limit. 0 used.   Authorization - Visit Number 6   Authorization - Number of Visits 25   OT Start Time 1015   OT Stop Time 1100   OT Time Calculation (min) 45 min   Activity Tolerance Patient tolerated treatment well   Behavior During Therapy WFL for tasks assessed/performed      Past Medical History  Diagnosis Date  . Depressed   . ADHD (attention deficit hyperactivity disorder)   . Dislocation of left shoulder joint   . Fracture of humeral head, left, closed     Past Surgical History  Procedure Laterality Date  . Orif humerus fracture Left 06/03/2015    Procedure: OPEN REDUCTION INTERNAL FIXATION LEFT PROXIMAL HUMERUS FRACTURE;  Surgeon: Vickki HearingStanley E Harrison, MD;  Location: [facility];  Service: Orthopedics;  Laterality: Left;    There were no vitals filed for this visit.  Visit Diagnosis:  Pain in joint, shoulder region, left  Tight fascia  Shoulder stiffness, left  Shoulder weakness      Subjective Assessment - 08/12/15 1109    Currently in Pain? Yes   Pain Score 7    Pain Location Shoulder   Pain Orientation Left   Pain Descriptors / Indicators Sharp;Shooting   Pain Type Acute pain   Pain Frequency Intermittent            [facility] OT Assessment - 08/12/15 1029    Assessment   Diagnosis S/P left humerus internal fixation device   Referring Provider Fuller CanadaStanley Harrison   Precautions   Precautions Shoulder   Type of Shoulder Precautions No  lifting at this time.   AROM   Overall AROM Comments Assessed supine. er/IR adducted.   AROM Assessment Site Shoulder   Right/Left Shoulder Left   Left Shoulder Flexion 135 Degrees  previous: 35   Left Shoulder ABduction 80 Degrees  previous: 57   Left Shoulder Internal Rotation 90 Degrees  previous: 90   Left Shoulder External Rotation 15 Degrees  previous: 0   PROM   Overall PROM Comments Assessed supine. IR/er adducted.   PROM Assessment Site Shoulder   Right/Left Shoulder Left   Left Shoulder Flexion 125 Degrees  previous: 125   Left Shoulder ABduction 113 Degrees  previous: 85   Left Shoulder Internal Rotation 90 Degrees  previous: 90   Left Shoulder External Rotation 30 Degrees  previous: 25   Strength   Overall Strength Unable to assess;Due to precautions;Due to pain   Strength Assessment Site Shoulder   Right/Left Shoulder Left                  OT Treatments/Exercises (OP) - 08/12/15 1040    Exercises   Exercises Shoulder   Shoulder Exercises: Supine   Protraction PROM;10 reps;AAROM;12 reps   Horizontal ABduction PROM;10 reps;AAROM;12 reps   External Rotation PROM;10 reps;AAROM;12 reps   Internal Rotation PROM;10 reps;AAROM;12 reps   Flexion PROM;10 reps;AAROM;12 reps   ABduction PROM;5 reps;AAROM;12 reps  Shoulder Exercises: Standing   Protraction AAROM;10 reps   Horizontal ABduction AAROM;10 reps   External Rotation AAROM;10 reps   Internal Rotation AAROM;10 reps   Flexion AAROM;10 reps   ABduction AAROM;10 reps   Manual Therapy   Manual Therapy Myofascial release;Muscle Energy Technique   Myofascial Release Myofascial release and manual stretching to left upper arm, trapezius, and scapularis region to decrease fascial restrictions and increase joint mobility in a pain free zone.    Muscle Energy Technique Muscle energy technique to left anterior deltoid to decrease muscle spasm and increase joint ROM.                OT Education -  08/12/15 1058    Education provided Yes   Education Details AA/ROM exercises   Person(s) Educated Patient          OT Short Term Goals - 08/12/15 1108    OT SHORT TERM GOAL #1   Title Paitent will be educated and independent with HEP.   Time 3   Period Weeks   Status On-going   OT SHORT TERM GOAL #2   Title Patient will increase PROM to [facility] to increase ability to flush the toilet.    Time 3   Period Weeks   Status On-going   OT SHORT TERM GOAL #3   Title Patient will increase strength in LUE to 3/5 to increase ability to reach up to overhead cabinets with less difficulty.    Time 3   Period Weeks   Status On-going   OT SHORT TERM GOAL #4   Title Patient will decrease pain during daily tasks to 5/10.   Time 3   Period Weeks   Status On-going   OT SHORT TERM GOAL #5   Title Patient will decrease fascial restrictions to mod amount.    Time 3   Period Weeks   Status Achieved           OT Long Term Goals - 07/12/15 1003    OT LONG TERM GOAL #1   Title patient will return to highest level of independence with all daily and leisure activties.    Time 6   Period Weeks   Status On-going   OT LONG TERM GOAL #2   Title Patient will increase LUE strength to 4/5 to increase ability to complete all daily activities with LUE as non dominant.    Time 6   Period Weeks   Status On-going   OT LONG TERM GOAL #3   Title Patient will increase AROM to [facility] to increase ability to complete activities above shoulder height.    Time 6   Period Weeks   Status On-going   OT LONG TERM GOAL #4   Title Patient will decrease fascial restrictions to min amount.    Time 6   Period Weeks   Status On-going   OT LONG TERM GOAL #5   Title Patient will decrease pain level during daily tasks to 3/10 or less.    Time 6   Period Weeks   Status On-going               Plan - 08/12/15 1105    Clinical Impression Statement A: Mini reassessment completed this date. patient has made great  improvement with both A/ROM and P/ROM supine. Continues to have difficulty with raising arm up to put deodorant on and reaching into cabinets. Pt does report that he feels looser since he's started therapy and every time he  leaves he is able to tell a difference. Pt reports that he has been having intermittent periods of pain that last a couple seconds and then are gone. Added AA/ROM exercises to HEP. manual therapy was done seperately from other treatment    Plan P: Follow up on AA/ROM exercises added to HEP last session. Add pulleys.         Problem List Patient Active Problem List   Diagnosis Date Noted  . Fracture of greater tuberosity of left humerus   . CLOSED FRACTURE OF HEAD OF RADIUS 02/24/2008    Limmie Patricia, OTR/L,CBIS  [phone]  08/12/2015, 11:13 AM   [facility] [address] [place], [place], [address] Phone: [phone]   Fax:  [phone]  Name: George [name] MRN: [id number] Date of Birth: Dec 12, 1990

## 2015-08-12 NOTE — Patient Instructions (Signed)
Perform each exercise _____12___ reps. 2-3x days.   Protraction - STANDING  Start by holding a wand or cane at chest height.  Next, slowly push the wand outwards in front of your body so that your elbows become fully straightened. Then, return to the original position.     Shoulder FLEXION - STANDING - PALMS UP  In the standing position, hold a wand/cane with both arms, palms up on both sides. Raise up the wand/cane allowing your unaffected arm to perform most of the effort. Your affected arm should be partially relaxed.      Internal/External ROTATION - STANDING  In the standing position, hold a wand/cane with both hands keeping your elbows bent. Move your arms and wand/cane to one side.  Your affected arm should be partially relaxed while your unaffected arm performs most of the effort.       Shoulder ABDUCTION - STANDING  While holding a wand/cane palm face up on the injured side and palm face down on the uninjured side, slowly raise up your injured arm to the side.       Horizontal Abduction/Adduction      Straight arms holding cane at shoulder height, bring cane to right, center, left. Repeat starting to left.

## 2015-08-15 ENCOUNTER — Ambulatory Visit (HOSPITAL_COMMUNITY): Payer: BLUE CROSS/BLUE SHIELD

## 2015-08-17 ENCOUNTER — Encounter (HOSPITAL_COMMUNITY): Payer: BLUE CROSS/BLUE SHIELD

## 2015-08-22 ENCOUNTER — Telehealth (HOSPITAL_COMMUNITY): Payer: Self-pay

## 2015-08-22 ENCOUNTER — Ambulatory Visit (HOSPITAL_COMMUNITY): Payer: BLUE CROSS/BLUE SHIELD | Attending: Orthopedic Surgery | Admitting: Occupational Therapy

## 2015-08-22 DIAGNOSIS — M25512 Pain in left shoulder: Secondary | ICD-10-CM | POA: Insufficient documentation

## 2015-08-22 DIAGNOSIS — R29898 Other symptoms and signs involving the musculoskeletal system: Secondary | ICD-10-CM | POA: Insufficient documentation

## 2015-08-22 DIAGNOSIS — M629 Disorder of muscle, unspecified: Secondary | ICD-10-CM | POA: Insufficient documentation

## 2015-08-22 DIAGNOSIS — M25612 Stiffness of left shoulder, not elsewhere classified: Secondary | ICD-10-CM | POA: Insufficient documentation

## 2015-08-22 NOTE — Telephone Encounter (Signed)
Mother called and stated: Lack of transportation no car available, cx 11/14 and 11/23rd, NF 11/716

## 2015-08-24 ENCOUNTER — Encounter (HOSPITAL_COMMUNITY): Payer: BLUE CROSS/BLUE SHIELD | Admitting: Occupational Therapy

## 2015-08-29 ENCOUNTER — Encounter (HOSPITAL_COMMUNITY): Payer: BLUE CROSS/BLUE SHIELD

## 2015-08-31 ENCOUNTER — Encounter (HOSPITAL_COMMUNITY): Payer: BLUE CROSS/BLUE SHIELD | Admitting: Occupational Therapy

## 2015-09-01 ENCOUNTER — Ambulatory Visit (INDEPENDENT_AMBULATORY_CARE_PROVIDER_SITE_OTHER): Payer: Self-pay | Admitting: Orthopedic Surgery

## 2015-09-01 VITALS — BP 134/90 | Ht 72.0 in | Wt 222.0 lb

## 2015-09-01 DIAGNOSIS — S42202D Unspecified fracture of upper end of left humerus, subsequent encounter for fracture with routine healing: Secondary | ICD-10-CM

## 2015-09-01 MED ORDER — HYDROCODONE-ACETAMINOPHEN 7.5-325 MG PO TABS: 1.0000 | ORAL_TABLET | Freq: Four times a day (QID) | ORAL | Status: DC | PRN

## 2015-09-02 NOTE — Progress Notes (Signed)
Chief Complaint  Patient presents with  . Follow-up    4 week recheck on left shoulder, DOS 06-03-15.    The patient has prostate 3 months status post open treatment internal fixation of a greater tuberosity fracture. He's been unable to his pain physical therapy secondary to transportation issues.  He has limited external rotation 15 active 30 passive passive flexion of the 125. Recommend continue physical therapy continue current pain management with Norco and follow-up in one month

## 2015-09-05 ENCOUNTER — Ambulatory Visit (HOSPITAL_COMMUNITY): Payer: BLUE CROSS/BLUE SHIELD | Admitting: Specialist

## 2015-09-05 ENCOUNTER — Telehealth (HOSPITAL_COMMUNITY): Payer: Self-pay

## 2015-09-05 NOTE — Telephone Encounter (Signed)
[facility]  [address]  [place] [address]  Phone: [phone]  Fax: [phone]    Encounter Date: 08/20/2022    Patient ID:  George [name]  MRN:[id number]    DOB: 04/13/1977  Age: 24 y.o. male    Subjective:     Chief Complaint   Patient presents with    Follow Up     24 yo male here for a sick visit today. He reports he was seen by the occupational specialist in [place] recently and was told that he has pneumonia. He is having sinus drainage, congestion, cough, earaches, and malaise. VSS. Afebrile. He has a chronic cough. He sees Dr [facility], he will see him again soon.   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 He has noticed some hearing loss in his left ear. He is wanting to consult Dr White ENT for his ear issues.   He reports he would like to increase his dose of Lexapro today, he has anxiety/depression issues. 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 He denies SI/HI.   He is also requesting to consult Dr [facility] for his bilat knee pain.     The history is provided by the patient and medical records.     Current Outpatient Medications   Medication Sig    albuterol sulfate (PROVENTIL OR VENTOLIN OR PROAIR) 90 mcg/actuation Inhalation oral inhaler Take 2 Puffs by inhalation Every 6 hours as needed Indications: chronic obstructive pulmonary disease    amoxicillin-pot clavulanate (AUGMENTIN) 875-125 mg Oral Tablet Take 1 Tablet by mouth Twice daily for 10 days    cetirizine (ZYRTEC) 10 mg Oral Tablet Take 1 Tablet (10 mg total) by mouth Once a day Indications: seasonal runny nose    ergocalciferol, vitamin D2, (DRISDOL) 1,250 mcg (50,000 unit) Oral Capsule Take 1 Capsule (50,000 Units total) by mouth Every 7 days Indications: low vitamin D levels    escitalopram oxalate (LEXAPRO) 20 mg Oral Tablet Take 1 Tablet (20 mg total) by mouth Once a day Indications:  anxiousness associated with depression    fenofibrate micronized (LOFIBRA) 200 mg Oral Capsule Take 1 Capsule (200 mg total) by mouth Once a day Indications: excessive fat in the blood    fluticasone propion-salmeteroL (ADVAIR) 250-50 mcg/dose Inhalation oral diskus inhaler INHALE 1 DOSE BY MOUTH TWICE DAILY    FREESTYLE LANCETS 28 gauge Misc USE 1 LANCET TO CHECK GLUCOSE TWICE DAILY    FREESTYLE LITE METER Kit USE TO CHECK BLOOD SUGAR TWICE DAILY    FREESTYLE LITE STRIPS Strip USE 1 STRIP TO CHECK GLUCOSE TWICE DAILY    glipiZIDE (GLUCOTROL XL) 10 mg Oral Tablet Extended Rel 24 hr Take 1 Tablet (10 mg total) by mouth Once a day    losartan (COZAAR) 25 mg Oral Tablet Take 1 Tablet (25 mg total) by mouth Once a day Indications: high blood pressure    Methylprednisolone (MEDROL DOSEPACK) 4 mg Oral Tablets, Dose Pack Take as instructed.    omeprazole (PRILOSEC) 40 mg Oral Capsule, Delayed Release(E.C.) Take 1 Capsule (40 mg total) by mouth Once a day Indications: gastroesophageal reflux disease    rosuvastatin (CRESTOR) 40 mg Oral Tablet Take 1 Tablet (40 mg total) by mouth Every evening    sucralfate (CARAFATE) 1 gram Oral Tablet Take 1 Tablet (1 g total) by mouth Every night     No Known Allergies    Past Medical History:   Diagnosis Date    Acid reflux 12/31/2018    Allergic rhinitis 03/28/2021    Anxiety 12/31/2018    Anxiety     Back problem     Disorder of liver     elevated liver enzymes    Ear tumors     Left ear cholesteatoma    GERD (gastroesophageal reflux disease)     H/O hearing loss     left ear cholesteatoma    Hearing loss     Hiatal hernia 12/31/2018    History of esophagitis 12/31/2018    History of gastritis 12/31/2018    Hyperlipidemia          Past Surgical History:   Procedure Laterality Date    ARM WOUND REPAIR / CLOSURE Right 2010    ESOPHAGOGASTRODUODENOSCOPY  10/26/2015    HX CHOLECYSTECTOMY      HX EAR SURGERY      left ear    HX HERNIA REPAIR  02/2019    HX TYMPANOPLASTY      HX WISDOM TEETH  EXTRACTION      ORTHOPEDIC SURGERY Right     arm    SHOULDER ARTHROSCOPY W/ ROTATOR CUFF REPAIR      SHOULDER OPEN ROTATOR CUFF REPAIR      SHOULDER SURGERY Right          Family Medical History:       Problem Relation (Age of Onset)    Asthma Maternal Grandfather, Paternal Grandfather    Cancer Maternal Grandfather, Paternal Grandfather    Diabetes Maternal Grandmother    Hearing Loss Father, Maternal Grandfather, Paternal Grandfather    Hypertension (High Blood Pressure) Mother            Social History     Tobacco Use    Smoking status: Former     Packs/day: 0.25     Years: 14.00     Additional pack years: 0.00     Total pack years: 3.50     Types: Cigarettes     Quit date: 2007     Years since quitting: 16.8     Passive exposure: Past    Smokeless tobacco: Current     Types: Chew    Tobacco comments:     occasionally    Vaping Use    Vaping Use: Never used   Substance Use Topics    Alcohol use: Yes     Alcohol/week: 3.0 standard drinks of alcohol     Types: 3 Shots of liquor per week    Drug use: Never       Review of Systems   Constitutional:  Positive for fatigue.   HENT:  Positive for congestion, hearing loss, sinus pressure, sinus pain and sore throat.    Eyes: Negative.    Respiratory:  Positive for cough.    Cardiovascular: Negative.    Gastrointestinal: Negative.    Endocrine: Negative.    Genitourinary: Negative.    Musculoskeletal:  Positive for arthralgias, back pain and myalgias.   Skin: Negative.    Allergic/Immunologic: Positive for environmental allergies.   Neurological: Negative.    Hematological: Negative.    Psychiatric/Behavioral:  Positive for dysphoric mood and sleep disturbance.      Objective:   Vitals: BP 132/80   Pulse 90   Temp 36.8 C (98.3 F)   Resp 18   Ht 1.779 m (5' 10.04")   Wt 103 kg (226 lb)   SpO2 97%   BMI   32.39 kg/m         Physical Exam  Vitals and nursing note reviewed.   Constitutional:       Appearance: Normal appearance.   HENT:      Head: Normocephalic and  atraumatic.      Right Ear: Ear canal and external ear normal.      Left Ear: Ear canal and external ear normal.      Nose: Nose normal.      Mouth/Throat:      Mouth: Mucous membranes are moist.   Eyes:      Extraocular Movements: Extraocular movements intact.      Conjunctiva/sclera: Conjunctivae normal.   Cardiovascular:      Rate and Rhythm: Normal rate and regular rhythm.      Pulses: Normal pulses.      Heart sounds: Normal heart sounds.   Pulmonary:      Breath sounds: Wheezing present.   Musculoskeletal:         General: Normal range of motion.      Cervical back: Normal range of motion and neck supple.   Skin:     General: Skin is warm and dry.   Neurological:      General: No focal deficit present.      Mental Status: He is alert and oriented to person, place, and time.   Psychiatric:         Mood and Affect: Mood normal.         Behavior: Behavior normal.         Assessment & Plan:     ENCOUNTER DIAGNOSES     ICD-10-CM   1. Pneumonia  J18.9   2. Cough, unspecified type  R05.9   3. Malaise and fatigue  R53.81    R53.83   4. Otalgia of left ear  H92.02   5. Symptoms of depression  R45.89   6. Arthralgia, unspecified joint  M25.50   7. Bilateral knee pain  M25.561    M25.562       Orders Placed This Encounter    Referral to External Provider (AMB)    Refer to [facility],([facility] Bail[facility]    escitalopram oxalate (LEXAPRO) 20 mg Oral Tablet    amoxicillin-pot clavulanate (AUGMENTIN) 875-125 mg Oral Tablet    Methylprednisolone (MEDROL DOSEPACK) 4 mg Oral Tablets, Dose Pack       Return in about 2 weeks (around 09/03/2022).    Crystal Rose Taylor, APRN

## 2015-09-05 NOTE — Telephone Encounter (Signed)
Patient have a upper respiratory infection.

## 2015-09-06 ENCOUNTER — Ambulatory Visit (HOSPITAL_COMMUNITY): Payer: BLUE CROSS/BLUE SHIELD

## 2015-09-06 ENCOUNTER — Encounter (HOSPITAL_COMMUNITY): Payer: Self-pay

## 2015-09-06 DIAGNOSIS — M25612 Stiffness of left shoulder, not elsewhere classified: Secondary | ICD-10-CM

## 2015-09-06 DIAGNOSIS — M25512 Pain in left shoulder: Secondary | ICD-10-CM

## 2015-09-06 DIAGNOSIS — R29898 Other symptoms and signs involving the musculoskeletal system: Secondary | ICD-10-CM | POA: Diagnosis present

## 2015-09-06 DIAGNOSIS — M6289 Other specified disorders of muscle: Secondary | ICD-10-CM

## 2015-09-06 DIAGNOSIS — M629 Disorder of muscle, unspecified: Secondary | ICD-10-CM

## 2015-09-06 NOTE — Patient Instructions (Signed)
1) Shoulder Protraction    Begin with elbows by your side, slowly "punch" straight out in front of you keeping arms/elbows straight. Repeat 10-12___times, __2-3__set/day.     2) Shoulder Flexion  Supine:     Standing:         Begin with arms at your side with thumbs pointed up, slowly raise both arms up and forward towards overhead. Repeat_10-12__times, _2-3__set/day.      3) Horizontal abduction/adduction  Supine:   Standing:           Begin with arms straight out in front of you, bring out to the side in at "T" shape. Keep arms straight entire time. Repeat _10-12___times, _2-3___sets/day.      4) Internal & External Rotation    *No band* -Stand with elbows at the side and elbows bent 90 degrees. Move your forearms away from your body, then bring back inward toward the body.  Repeat _10-12__times, __2-3_sets/day    5) Shoulder Abduction  Supine:     Standing:       Lying on your back begin with your arms flat on the table next to your side. Slowly move your arms out to the side so that they go overhead, in a jumping jack or snow angel movement. Repeat 10-12___times, _2-3__sets/day

## 2015-09-06 NOTE — Therapy (Signed)
[facility] [address] [place], [place], [address] Phone: [phone]   Fax:  [phone]  Occupational Therapy Treatment and reassessment (Re-cert)  Patient Details  Name: George [name] MRN: [id number] Date of Birth: [birth year] Referring Provider: Arther Abbott  Encounter Date: 09/06/2015      OT End of Session - 09/06/15 1528    Visit Number 7   Number of Visits 12   Date for OT Re-Evaluation 11/05/15  mini reassess: 10/04/15   Authorization Type BC[employer] - 25 visit limit. 0 used.   Authorization - Visit Number 7   Authorization - Number of Visits 25   OT Start Time 1430   OT Stop Time 1520   OT Time Calculation (min) 50 min   Activity Tolerance Patient tolerated treatment well   Behavior During Therapy WFL for tasks assessed/performed      Past Medical History  Diagnosis Date  . Depressed   . ADHD (attention deficit hyperactivity disorder)   . Dislocation of left shoulder joint   . Fracture of humeral head, left, closed     Past Surgical History  Procedure Laterality Date  . Orif humerus fracture Left 06/03/2015    Procedure: OPEN REDUCTION INTERNAL FIXATION LEFT PROXIMAL HUMERUS FRACTURE;  Surgeon: Carole Civil, MD;  Location: [facility];  Service: Orthopedics;  Laterality: Left;    There were no vitals filed for this visit.  Visit Diagnosis:  Pain in joint, shoulder region, left - Plan: Ot plan of care cert/re-cert  Tight fascia - Plan: Ot plan of care cert/re-cert  Shoulder stiffness, left - Plan: Ot plan of care cert/re-cert  Shoulder weakness - Plan: Ot plan of care cert/re-cert      Subjective Assessment - 09/06/15 1509    Subjective  S: I haven't been able to make it because of work and now I feel like I've tightened up.   Currently in Pain? Yes   Pain Score 8    Pain Location Shoulder   Pain Orientation Left   Pain Descriptors / Indicators Sore   Pain Type Acute pain            [facility]  OT Assessment - 09/06/15 1451    Assessment   Diagnosis S/P left humerus internal fixation device   Precautions   Precautions Shoulder   Type of Shoulder Precautions No lifting at this time.   AROM   Overall AROM Comments Assessed supine. er/IR adducted.   AROM Assessment Site Shoulder   Right/Left Shoulder Left   Left Shoulder Flexion 132 Degrees  previous: 135   Left Shoulder ABduction 100 Degrees  previous: 110   Left Shoulder Internal Rotation 90 Degrees  previous: 90   Left Shoulder External Rotation 35 Degrees  previous: 15   PROM   Overall PROM Comments Assessed supine. IR/er adducted.   PROM Assessment Site Shoulder   Right/Left Shoulder Left   Left Shoulder Flexion 140 Degrees  previous: 125   Left Shoulder ABduction 180 Degrees  previous: 180   Left Shoulder Internal Rotation 90 Degrees  previous: 90   Left Shoulder External Rotation 35 Degrees  previous: 35   Strength   Overall Strength Comments Assessed standing. IR/er adducted   Strength Assessment Site Shoulder   Right/Left Shoulder Left   Left Shoulder Flexion 3/5   Left Shoulder ABduction 3/5   Left Shoulder Internal Rotation 3/5   Left Shoulder External Rotation 3-/5  OT Treatments/Exercises (OP) - 09/06/15 1501    Exercises   Exercises Shoulder   Shoulder Exercises: Supine   Protraction PROM;5 reps;AROM;12 reps   Horizontal ABduction PROM;5 reps;AROM;12 reps   External Rotation PROM;5 reps;AROM;12 reps   Internal Rotation PROM;5 reps;AROM;12 reps   Flexion PROM;5 reps;AROM;12 reps   ABduction PROM;5 reps;AROM;12 reps   Shoulder Exercises: Standing   Protraction AROM;10 reps   Horizontal ABduction AROM;10 reps   External Rotation AROM;10 reps   Internal Rotation AROM;10 reps   Flexion AROM;10 reps   ABduction AROM;10 reps   Manual Therapy   Manual Therapy Myofascial release;Muscle Energy Technique   Manual therapy comments manual therapy completed prior  measurements and exercises.   Myofascial Release Myofascial release and manual stretching to left upper arm, trapezius, and scapularis region to decrease fascial restrictions and increase joint mobility in a pain free zone.    Muscle Energy Technique Muscle energy technique to left anterior deltoid to decrease muscle spasm and increase joint ROM.                OT Education - 09/06/15 1509    Education provided Yes   Education Details A/ROM exercises   Person(s) Educated Patient   Methods Handout;Verbal cues;Demonstration;Explanation   Comprehension Verbalized understanding;Returned demonstration          OT Short Term Goals - 09/06/15 1529    OT SHORT TERM GOAL #1   Title Paitent will be educated and independent with HEP.   Time 3   Period Weeks   Status Achieved   OT SHORT TERM GOAL #2   Title Patient will increase PROM to [facility] to increase ability to flush the toilet.    Time 3   Period Weeks   Status Achieved   OT SHORT TERM GOAL #3   Title Patient will increase strength in LUE to 3/5 to increase ability to reach up to overhead cabinets with less difficulty.    Time 3   Period Weeks   Status Achieved   OT SHORT TERM GOAL #4   Title Patient will decrease pain during daily tasks to 5/10.   Time 3   Period Weeks   Status On-going   OT SHORT TERM GOAL #5   Title Patient will decrease fascial restrictions to mod amount.    Time 3   Period Weeks   Status Achieved           OT Long Term Goals - 07/12/15 1003    OT LONG TERM GOAL #1   Title patient will return to highest level of independence with all daily and leisure activties.    Time 6   Period Weeks   Status On-going   OT LONG TERM GOAL #2   Title Patient will increase LUE strength to 4/5 to increase ability to complete all daily activities with LUE as non dominant.    Time 6   Period Weeks   Status On-going   OT LONG TERM GOAL #3   Title Patient will increase AROM to [facility] to increase ability to  complete activities above shoulder height.    Time 6   Period Weeks   Status On-going   OT LONG TERM GOAL #4   Title Patient will decrease fascial restrictions to min amount.    Time 6   Period Weeks   Status On-going   OT LONG TERM GOAL #5   Title Patient will decrease pain level during daily tasks to 3/10 or less.  Time 6   Period Weeks   Status On-going               Plan - 09/06/15 1529    Clinical Impression Statement A: Reassessment completed this date. patient has not been to therapy in 4 weeks due to work schedule and transportation issues. Even with patient's absence he has made improvement with passive and active ROM measurements. Patient has met 4/5 STGs and is progressing towards therapy goals. Recommended patient continue with therapy and we will reassess in 4 weeks. Pt requests to complete therapy 3X/week instead of two.    Plan P: Follow up on new A/ROM HEP. Add X to V arms and UBE bike.        Problem List Patient Active Problem List   Diagnosis Date Noted  . Fracture of greater tuberosity of left humerus   . CLOSED FRACTURE OF HEAD OF RADIUS 02/24/2008    Ailene Ravel, OTR/L,CBIS  [phone]  09/06/2015, 3:44 PM  [facility] [address] [place], [place], [address] Phone: [phone]   Fax:  [phone]  Name: George [name] MRN: [id number] Date of Birth: 1991-07-15

## 2015-09-07 ENCOUNTER — Encounter (HOSPITAL_COMMUNITY): Payer: BLUE CROSS/BLUE SHIELD | Admitting: Occupational Therapy

## 2015-09-07 ENCOUNTER — Encounter (HOSPITAL_COMMUNITY): Payer: BLUE CROSS/BLUE SHIELD

## 2015-09-16 ENCOUNTER — Encounter (HOSPITAL_COMMUNITY): Payer: BLUE CROSS/BLUE SHIELD | Admitting: Occupational Therapy

## 2015-09-19 ENCOUNTER — Telehealth (HOSPITAL_COMMUNITY): Payer: Self-pay | Admitting: Specialist

## 2015-09-19 ENCOUNTER — Ambulatory Visit (HOSPITAL_COMMUNITY): Payer: BLUE CROSS/BLUE SHIELD | Admitting: Specialist

## 2015-09-19 NOTE — Telephone Encounter (Signed)
He has been called into work today 09/19/2015 NF

## 2015-09-20 ENCOUNTER — Telehealth (HOSPITAL_COMMUNITY): Payer: Self-pay

## 2015-09-20 ENCOUNTER — Ambulatory Visit (HOSPITAL_COMMUNITY): Payer: BLUE CROSS/BLUE SHIELD | Attending: Orthopedic Surgery

## 2015-09-20 DIAGNOSIS — M25512 Pain in left shoulder: Secondary | ICD-10-CM | POA: Insufficient documentation

## 2015-09-20 DIAGNOSIS — R29898 Other symptoms and signs involving the musculoskeletal system: Secondary | ICD-10-CM | POA: Insufficient documentation

## 2015-09-20 DIAGNOSIS — M629 Disorder of muscle, unspecified: Secondary | ICD-10-CM | POA: Insufficient documentation

## 2015-09-20 DIAGNOSIS — M25612 Stiffness of left shoulder, not elsewhere classified: Secondary | ICD-10-CM | POA: Insufficient documentation

## 2015-09-20 NOTE — Telephone Encounter (Signed)
DATE: 09/20/15  Called and left message on voicemail regarding missed appt. Informed patient of next appt and to call if unable to make it.   Limmie PatriciaLaura Omarri Eich, OTR/L,CBIS  [phone]

## 2015-09-22 ENCOUNTER — Encounter (HOSPITAL_COMMUNITY): Payer: BLUE CROSS/BLUE SHIELD

## 2015-09-27 ENCOUNTER — Encounter (HOSPITAL_COMMUNITY): Payer: BLUE CROSS/BLUE SHIELD

## 2015-09-28 ENCOUNTER — Ambulatory Visit (HOSPITAL_COMMUNITY): Payer: BLUE CROSS/BLUE SHIELD | Admitting: Specialist

## 2015-09-29 ENCOUNTER — Ambulatory Visit (HOSPITAL_COMMUNITY): Payer: BLUE CROSS/BLUE SHIELD | Admitting: Occupational Therapy

## 2015-09-29 ENCOUNTER — Encounter (HOSPITAL_COMMUNITY): Payer: Self-pay | Admitting: Occupational Therapy

## 2015-09-29 DIAGNOSIS — M6289 Other specified disorders of muscle: Secondary | ICD-10-CM

## 2015-09-29 DIAGNOSIS — M25512 Pain in left shoulder: Secondary | ICD-10-CM | POA: Diagnosis present

## 2015-09-29 DIAGNOSIS — M25612 Stiffness of left shoulder, not elsewhere classified: Secondary | ICD-10-CM | POA: Diagnosis present

## 2015-09-29 DIAGNOSIS — R29898 Other symptoms and signs involving the musculoskeletal system: Secondary | ICD-10-CM

## 2015-09-29 DIAGNOSIS — M629 Disorder of muscle, unspecified: Secondary | ICD-10-CM

## 2015-09-29 NOTE — Patient Instructions (Signed)
   External Rotation stretch: Begin facing doorframe, left hand & forearm on wall. Slowly rotate body to the left while keeping elbow bent & forearm/hand on wall.        Internal Rotation Stretch: Hold towel with left hand behind back, right hand pulling towel up over shoulder.

## 2015-09-29 NOTE — Therapy (Signed)
[facility] [address] [place], [place], [address] Phone: [phone]   Fax:  [phone]  Occupational Therapy Treatment  Patient Details  Name: George [name] MRN: [id number] Date of Birth: 10-28-1990 Referring Provider: Fuller CanadaStanley Harrison  Encounter Date: 09/29/2015      OT End of Session - 09/29/15 1241    Visit Number 8   Number of Visits 12   Date for OT Re-Evaluation 11/05/15  mini reassess: 10/04/15   Authorization Type [employer] - 25 visit limit. 0 used.   Authorization - Visit Number 8   Authorization - Number of Visits 25   OT Start Time 1019   OT Stop Time 1102   OT Time Calculation (min) 43 min   Activity Tolerance Patient tolerated treatment well   Behavior During Therapy WFL for tasks assessed/performed      Past Medical History  Diagnosis Date  . Depressed   . ADHD (attention deficit hyperactivity disorder)   . Dislocation of left shoulder joint   . Fracture of humeral head, left, closed     Past Surgical History  Procedure Laterality Date  . Orif humerus fracture Left 06/03/2015    Procedure: OPEN REDUCTION INTERNAL FIXATION LEFT PROXIMAL HUMERUS FRACTURE;  Surgeon: Vickki HearingStanley E Harrison, MD;  Location: [facility];  Service: Orthopedics;  Laterality: Left;    There were no vitals filed for this visit.  Visit Diagnosis:  Pain in joint, shoulder region, left  Tight fascia  Shoulder stiffness, left  Shoulder weakness      Subjective Assessment - 09/29/15 1022    Subjective  S: This weather makes my shoulder hurt worse.    Currently in Pain? Yes   Pain Score 6    Pain Location Shoulder   Pain Orientation Left   Pain Descriptors / Indicators Sore   Pain Type Acute pain            [facility] OT Assessment - 09/29/15 1022    Assessment   Diagnosis S/P left humerus internal fixation device   Precautions   Precautions Shoulder   Type of Shoulder Precautions No lifting at this time.                   OT Treatments/Exercises (OP) - 09/29/15 1022    Exercises   Exercises Shoulder   Shoulder Exercises: Supine   Protraction PROM;5 reps;AROM;12 reps   Horizontal ABduction PROM;5 reps;AROM;12 reps   External Rotation PROM;5 reps;AROM;12 reps   Internal Rotation PROM;5 reps;AROM;12 reps   Flexion PROM;5 reps;AROM;12 reps   ABduction PROM;5 reps;AROM;12 reps   Shoulder Exercises: Standing   Protraction AROM;10 reps   Horizontal ABduction AROM;10 reps   External Rotation AROM;10 reps   Internal Rotation AROM;10 reps   Flexion AROM;10 reps   ABduction AROM;10 reps   Shoulder Exercises: ROM/Strengthening   X to V Arms 10X   Shoulder Exercises: Stretch   Internal Rotation Stretch 2 reps  10 seconds; towel stretch   External Rotation Stretch 2 reps;10 seconds  doorframe stretch   Manual Therapy   Manual Therapy Myofascial release;Muscle Energy Technique   Manual therapy comments manual therapy completed prior measurements and exercises.   Myofascial Release Myofascial release and manual stretching to left upper arm, trapezius, and scapularis region to decrease fascial restrictions and increase joint mobility in a pain free zone.    Muscle Energy Technique Muscle energy technique to left anterior deltoid to decrease muscle spasm and increase joint ROM.  OT Education - 09/29/15 1052    Education provided Yes   Education Details ER/IR stretches   Person(s) Educated Patient   Methods Explanation;Demonstration;Handout   Comprehension Verbalized understanding;Returned demonstration          OT Short Term Goals - 09/06/15 1529    OT SHORT TERM GOAL #1   Title Paitent will be educated and independent with HEP.   Time 3   Period Weeks   Status Achieved   OT SHORT TERM GOAL #2   Title Patient will increase PROM to [facility] to increase ability to flush the toilet.    Time 3   Period Weeks   Status Achieved   OT SHORT TERM GOAL #3   Title  Patient will increase strength in LUE to 3/5 to increase ability to reach up to overhead cabinets with less difficulty.    Time 3   Period Weeks   Status Achieved   OT SHORT TERM GOAL #4   Title Patient will decrease pain during daily tasks to 5/10.   Time 3   Period Weeks   Status On-going   OT SHORT TERM GOAL #5   Title Patient will decrease fascial restrictions to mod amount.    Time 3   Period Weeks   Status Achieved           OT Long Term Goals - 07/12/15 1003    OT LONG TERM GOAL #1   Title patient will return to highest level of independence with all daily and leisure activties.    Time 6   Period Weeks   Status On-going   OT LONG TERM GOAL #2   Title Patient will increase LUE strength to 4/5 to increase ability to complete all daily activities with LUE as non dominant.    Time 6   Period Weeks   Status On-going   OT LONG TERM GOAL #3   Title Patient will increase AROM to [facility] to increase ability to complete activities above shoulder height.    Time 6   Period Weeks   Status On-going   OT LONG TERM GOAL #4   Title Patient will decrease fascial restrictions to min amount.    Time 6   Period Weeks   Status On-going   OT LONG TERM GOAL #5   Title Patient will decrease pain level during daily tasks to 3/10 or less.    Time 6   Period Weeks   Status On-going               Plan - 09/29/15 1242    Clinical Impression Statement A: Added x to v arms, ER and IR stretches. Did not add UBE due to time constraints. Pt reports he has been completing his exercises daily and feels he has improved greatly. Instructed pt in A/ROM exercises and educated on no longer completing AA/ROM at home but continuing with A/ROM. P   Plan P: Follow up on A/ROM HEP. Add proximal shoulder strengthening and UBE bike.         Problem List Patient Active Problem List   Diagnosis Date Noted  . Fracture of greater tuberosity of left humerus   . CLOSED FRACTURE OF HEAD OF RADIUS  02/24/2008    Ezra Sites, OTR/L  [phone]  09/29/2015, 12:45 PM  [facility] [address] [place], [place], [address] Phone: [phone]   Fax:  [phone]  Name: George [name] MRN: [id number] Date of Birth: July 03, 1991

## 2015-10-04 ENCOUNTER — Encounter (HOSPITAL_COMMUNITY): Payer: BLUE CROSS/BLUE SHIELD | Admitting: Occupational Therapy

## 2015-10-04 ENCOUNTER — Ambulatory Visit (INDEPENDENT_AMBULATORY_CARE_PROVIDER_SITE_OTHER): Payer: BLUE CROSS/BLUE SHIELD | Admitting: Orthopedic Surgery

## 2015-10-04 VITALS — BP 142/93 | Ht 72.0 in | Wt 222.0 lb

## 2015-10-04 DIAGNOSIS — S42202D Unspecified fracture of upper end of left humerus, subsequent encounter for fracture with routine healing: Secondary | ICD-10-CM | POA: Diagnosis not present

## 2015-10-04 MED ORDER — HYDROCODONE-ACETAMINOPHEN 5-325 MG PO TABS: 1.0000 | ORAL_TABLET | Freq: Four times a day (QID) | ORAL | Status: DC | PRN

## 2015-10-04 NOTE — Progress Notes (Signed)
Chief Complaint  Patient presents with  . Follow-up    5 week follow up left shoulder, DOS 06/03/15    Meds ordered this encounter  Medications  . HYDROcodone-acetaminophen (NORCO/VICODIN) 5-325 MG tablet    Sig: Take 1 tablet by mouth every 6 (six) hours as needed for moderate pain.    Dispense:  56 tablet    Refill:  0    Colleen had another seizure he sore he's been in therapie he was able to regain 120 of passive flexion external rotation tight 30  1 month

## 2015-10-06 ENCOUNTER — Encounter (HOSPITAL_COMMUNITY): Payer: BLUE CROSS/BLUE SHIELD

## 2015-10-07 ENCOUNTER — Ambulatory Visit (HOSPITAL_COMMUNITY): Payer: BLUE CROSS/BLUE SHIELD

## 2015-10-11 ENCOUNTER — Encounter (HOSPITAL_COMMUNITY): Payer: BLUE CROSS/BLUE SHIELD | Admitting: Specialist

## 2015-10-12 ENCOUNTER — Ambulatory Visit (HOSPITAL_COMMUNITY): Payer: BLUE CROSS/BLUE SHIELD

## 2015-10-12 ENCOUNTER — Telehealth (HOSPITAL_COMMUNITY): Payer: Self-pay

## 2015-10-12 NOTE — Telephone Encounter (Signed)
Pateint was called into work. NF

## 2015-10-13 ENCOUNTER — Ambulatory Visit (HOSPITAL_COMMUNITY): Payer: BLUE CROSS/BLUE SHIELD

## 2015-10-13 ENCOUNTER — Encounter (HOSPITAL_COMMUNITY): Payer: Self-pay

## 2015-10-13 ENCOUNTER — Telehealth (HOSPITAL_COMMUNITY): Payer: Self-pay

## 2015-10-13 NOTE — Therapy (Signed)
[place], [place], [address] Phone: [phone]   Fax:  [phone]  Patient Details  Name: George [name] MRN: [id number] Date of Birth: October 02, 1991 Referring Provider:  No ref. provider found  Encounter Date: 10/13/2015 OCCUPATIONAL THERAPY DISCHARGE SUMMARY  Visits from Start of Care: 8  Current functional level related to goals / functional outcomes: OT LONG TERM GOAL #1    Title patient will return to highest level of independence with all daily and leisure activties.    Time 6   Period Weeks   Status On-going   OT LONG TERM GOAL #2   Title Patient will increase LUE strength to 4/5 to increase ability to complete all daily activities with LUE as non dominant.    Time 6   Period Weeks   Status On-going   OT LONG TERM GOAL #3   Title Patient will increase AROM to [facility] to increase ability to complete activities above shoulder height.    Time 6   Period Weeks   Status On-going   OT LONG TERM GOAL #4   Title Patient will decrease fascial restrictions to min amount.    Time 6   Period Weeks   Status On-going   OT LONG TERM GOAL #5   Title Patient will decrease pain level during daily tasks to 3/10 or less.    Time 6   Period Weeks   Status On-going          Remaining deficits: Patient is being discharged due to 3 no shows and lack of attendance with therapy. In 14 weeks, patient has completed 8 out of 35 appointments scheduled. Remaining deficits unknown at this time as patient's last completed therapy appt was 09/29/15.    Education / Equipment:  Table slides, pendulum exercises, AA/ROM and A/ROM exercises Plan:                                                    Patient goals were not met. Patient is being discharged due to                                                     ?????3 no shows and lack of attendance.           Ailene Ravel, OTR/L,CBIS  [phone]  10/13/2015, 10:37 AM  [facility] [address] [place], [place], [address] Phone: [phone]   Fax:  [phone]

## 2015-10-13 NOTE — Telephone Encounter (Signed)
Date: 10/13/15  Called and left message regarding patient's no show for appt today at 10:15. Informed patient that due to lack of attendance and this being his 3rd no show he will be discharge from therapy. If he requires therapy in a the future he will need a new MD order.   Limmie PatriciaLaura Curlie Macken, OTR/L,CBIS  [phone]

## 2015-10-28 ENCOUNTER — Emergency Department (HOSPITAL_COMMUNITY)
Admission: EM | Admit: 2015-10-28 | Discharge: 2015-10-28 | Disposition: A | Discharge status: Home / Self Care | Payer: BLUE CROSS/BLUE SHIELD | Attending: Emergency Medicine | Admitting: Emergency Medicine

## 2015-10-28 ENCOUNTER — Emergency Department (HOSPITAL_COMMUNITY): Payer: BLUE CROSS/BLUE SHIELD

## 2015-10-28 ENCOUNTER — Encounter (HOSPITAL_COMMUNITY): Payer: Self-pay

## 2015-10-28 DIAGNOSIS — R569 Unspecified convulsions: Secondary | ICD-10-CM | POA: Diagnosis not present

## 2015-10-28 DIAGNOSIS — Y9289 Other specified places as the place of occurrence of the external cause: Secondary | ICD-10-CM | POA: Diagnosis not present

## 2015-10-28 DIAGNOSIS — Y998 Other external cause status: Secondary | ICD-10-CM | POA: Insufficient documentation

## 2015-10-28 DIAGNOSIS — F17210 Nicotine dependence, cigarettes, uncomplicated: Secondary | ICD-10-CM | POA: Diagnosis not present

## 2015-10-28 DIAGNOSIS — F909 Attention-deficit hyperactivity disorder, unspecified type: Secondary | ICD-10-CM | POA: Insufficient documentation

## 2015-10-28 DIAGNOSIS — F329 Major depressive disorder, single episode, unspecified: Secondary | ICD-10-CM | POA: Insufficient documentation

## 2015-10-28 DIAGNOSIS — S43004A Unspecified dislocation of right shoulder joint, initial encounter: Secondary | ICD-10-CM

## 2015-10-28 DIAGNOSIS — Y9389 Activity, other specified: Secondary | ICD-10-CM | POA: Insufficient documentation

## 2015-10-28 DIAGNOSIS — X58XXXA Exposure to other specified factors, initial encounter: Secondary | ICD-10-CM | POA: Diagnosis not present

## 2015-10-28 DIAGNOSIS — Z79899 Other long term (current) drug therapy: Secondary | ICD-10-CM | POA: Insufficient documentation

## 2015-10-28 DIAGNOSIS — S4991XA Unspecified injury of right shoulder and upper arm, initial encounter: Secondary | ICD-10-CM | POA: Diagnosis present

## 2015-10-28 LAB — COMPREHENSIVE METABOLIC PANEL
ALK PHOS: 73 U/L (ref 38–126)
ALT: 27 U/L (ref 17–63)
AST: 25 U/L (ref 15–41)
Albumin: 4.8 g/dL (ref 3.5–5.0)
Anion gap: 10 (ref 5–15)
BUN: 15 mg/dL (ref 6–20)
CALCIUM: 10.7 mg/dL — AB (ref 8.9–10.3)
CHLORIDE: 107 mmol/L (ref 101–111)
CO2: 21 mmol/L — ABNORMAL LOW (ref 22–32)
CREATININE: 0.87 mg/dL (ref 0.61–1.24)
Glucose, Bld: 136 mg/dL — ABNORMAL HIGH (ref 65–99)
Potassium: 4.4 mmol/L (ref 3.5–5.1)
Sodium: 138 mmol/L (ref 135–145)
Total Bilirubin: 0.8 mg/dL (ref 0.3–1.2)
Total Protein: 7.7 g/dL (ref 6.5–8.1)

## 2015-10-28 LAB — CBC WITH DIFFERENTIAL/PLATELET
Basophils Absolute: 0 10*3/uL (ref 0.0–0.1)
Basophils Relative: 0 %
Eosinophils Absolute: 0 10*3/uL (ref 0.0–0.7)
Eosinophils Relative: 0 %
HCT: 42 % (ref 39.0–52.0)
Hemoglobin: 14.6 g/dL (ref 13.0–17.0)
Lymphocytes Relative: 14 %
Lymphs Abs: 2.4 10*3/uL (ref 0.7–4.0)
MCH: 31.6 pg (ref 26.0–34.0)
MCHC: 34.8 g/dL (ref 30.0–36.0)
MCV: 90.9 fL (ref 78.0–100.0)
Monocytes Absolute: 1.2 10*3/uL — ABNORMAL HIGH (ref 0.1–1.0)
Monocytes Relative: 7 %
Neutro Abs: 14 10*3/uL — ABNORMAL HIGH (ref 1.7–7.7)
Neutrophils Relative %: 79 %
Platelets: 295 10*3/uL (ref 150–400)
RBC: 4.62 MIL/uL (ref 4.22–5.81)
RDW: 13.5 % (ref 11.5–15.5)
WBC: 17.6 10*3/uL — ABNORMAL HIGH (ref 4.0–10.5)

## 2015-10-28 MED ORDER — LEVETIRACETAM 500 MG PO TABS: 500.0000 mg | ORAL_TABLET | Freq: Two times a day (BID) | ORAL | Status: DC

## 2015-10-28 MED ORDER — SODIUM CHLORIDE 0.9 % IV BOLUS (SEPSIS)
1000.0000 mL | Freq: Once | INTRAVENOUS | Status: AC
  Administered 2015-10-28: 1000 mL via INTRAVENOUS

## 2015-10-28 MED ORDER — HYDROMORPHONE HCL 1 MG/ML IJ SOLN
1.0000 mg | Freq: Once | INTRAMUSCULAR | Status: AC
  Administered 2015-10-28: 1 mg via INTRAVENOUS
  Filled 2015-10-28: qty 1

## 2015-10-28 MED ORDER — ONDANSETRON HCL 4 MG/2ML IJ SOLN
4.0000 mg | Freq: Once | INTRAMUSCULAR | Status: AC
Start: 2015-10-28 — End: 2015-10-28
  Administered 2015-10-28: 4 mg via INTRAVENOUS
  Filled 2015-10-28: qty 2

## 2015-10-28 MED ORDER — LEVETIRACETAM 500 MG PO TABS
500.0000 mg | ORAL_TABLET | Freq: Once | ORAL | Status: AC
  Administered 2015-10-28: 500 mg via ORAL
  Filled 2015-10-28: qty 1

## 2015-10-28 MED ORDER — PROPOFOL 10 MG/ML IV BOLUS
INTRAVENOUS | Status: AC | PRN
  Administered 2015-10-28: 50 mg via INTRAVENOUS

## 2015-10-28 MED ORDER — HYDROCODONE-ACETAMINOPHEN 5-325 MG PO TABS: 1.0000 | ORAL_TABLET | Freq: Four times a day (QID) | ORAL | Status: DC | PRN

## 2015-10-28 MED ORDER — PROPOFOL 10 MG/ML IV BOLUS
1.0000 mg/kg | Freq: Once | INTRAVENOUS | Status: AC
  Administered 2015-10-28: 102.1 mg via INTRAVENOUS
  Filled 2015-10-28: qty 20

## 2015-10-28 NOTE — ED Notes (Signed)
Pt alert, oriented, able to answer all questions,

## 2015-10-28 NOTE — ED Notes (Signed)
Pt reports that his ride will be here between 7-7:30 this am,

## 2015-10-28 NOTE — ED Notes (Signed)
Pt was at home with family, reportedly parents heard a racket upstairs and went to find pt possibly had a seizure although no seizure meds.  Pt apparently had a seizure 3 months ago when he injured his left shoulder and with subsequent surgery to same.

## 2015-10-28 NOTE — Discharge Instructions (Signed)
No driving or performing any other activity that may be dangerous to herself or anyone else until you have been cleared by a neurologist.   Seizure, Adult A seizure is abnormal electrical activity in the brain. Seizures usually last from 30 seconds to 2 minutes. There are various types of seizures. Before a seizure, you may have a warning sensation (aura) that a seizure is about to occur. An aura may include the following symptoms:   Fear or anxiety.  Nausea.  Feeling like the room is spinning (vertigo).  Vision changes, such as seeing flashing lights or spots. Common symptoms during a seizure include:  A change in attention or behavior (altered mental status).  Convulsions with rhythmic jerking movements.  Drooling.  Rapid eye movements.  Grunting.  Loss of bladder and bowel control.  Bitter taste in the mouth.  Tongue biting. After a seizure, you may feel confused and sleepy. You may also have an injury resulting from convulsions during the seizure. HOME CARE INSTRUCTIONS   If you are given medicines, take them exactly as prescribed by your health care provider.  Keep all follow-up appointments as directed by your health care provider.  Do not swim or drive or engage in risky activity during which a seizure could cause further injury to you or others until your health care provider says it is OK.  Get adequate rest.  Teach friends and family what to do if you have a seizure. They should:  Lay you on the ground to prevent a fall.  Put a cushion under your head.  Loosen any tight clothing around your neck.  Turn you on your side. If vomiting occurs, this helps keep your airway clear.  Stay with you until you recover.  Know whether or not you need emergency care. SEEK IMMEDIATE MEDICAL CARE IF:  The seizure lasts longer than 5 minutes.  The seizure is severe or you do not wake up immediately after the seizure.  You have an altered mental status after the  seizure.  You are having more frequent or worsening seizures. Someone should drive you to the emergency department or call local emergency services (911 in [place] MAKE SURE YOU:  Understand these instructions.  Will watch your condition.  Will get help right away if you are not doing well or get worse.   This information is not intended to replace advice given to you by your health care provider. Make sure you discuss any questions you have with your health care provider.   Document Released: 09/28/2000 Document Revised: 10/22/2014 Document Reviewed: 05/13/2013 Elsevier Interactive Patient Education 2016 [employer].    How to Use a Shoulder Immobilizer A shoulder immobilizer is a device that you may have to wear after a shoulder injury or surgery. This device keeps your arm from moving. This prevents additional pain or injury. It also supports your arm next to your body as your shoulder heals. You may need to wear a shoulder immobilizer to treat a broken bone (fracture) in your shoulder. You may also need to wear one if you have an injury that moves your shoulder out of position (dislocation). There are different types of shoulder immobilizers. The one that you get depends on your injury. RISKS AND COMPLICATIONS Wearing a shoulder immobilizer in the wrong way can let your injured shoulder move around too much. This may delay healing and make your pain and swelling worse. HOW TO USE YOUR SHOULDER IMMOBILIZER  The part of the immobilizer that goes around your  neck (sling) should support your upper arm, with your elbow bent and your lower arm and hand across your chest.  Make sure that your elbow:  Is snug against the back pocket of the sling.  Does not move away from your body.  The strap of the immobilizer should go over your shoulder and support your arm and hand. Your hand should be slightly higher than your elbow. It should not hang loosely over the edge of the sling.  If the  long strap has a pad, place it where it is most comfortable on your neck.  Carefully follow your health care provider's instructions for wearing your shoulder immobilizer. Your health care provider may want you to:  Loosen your immobilizer to straighten your elbow and move your wrist and fingers. You may have to do this several times each day. Ask your health care provider when you should do this and how often.  Remove your immobilizer once every day to shower, but limit the movement in your injured arm. Before putting the immobilizer back on, use a towel to dry the area under your arm completely.  Remove your immobilizer to do shoulder exercises at home as directed by your health care provider.  Wear your immobilizer while you sleep. You may sleep more comfortably if you have your upper body raised on pillows. SEEK MEDICAL CARE IF:  Your immobilizer is not supporting your arm properly.  Your immobilizer gets damaged.  You have worsening pain or swelling in your shoulder, arm, or hand.  Your shoulder, arm, or hand changes color or temperature.  You lose feeling in your shoulder, arm, or hand.   This information is not intended to replace advice given to you by your health care provider. Make sure you discuss any questions you have with your health care provider.   Document Released: 11/08/2004 Document Revised: 02/15/2015 Document Reviewed: 09/08/2014 Elsevier Interactive Patient Education 2016 [employer] Inc[employer]  Shoulder Dislocation A shoulder dislocation happens when the upper arm bone (humerus) moves out of the shoulder joint. The shoulder joint is the part of the shoulder where the humerus, shoulder blade (scapula), and collarbone (clavicle) meet. CAUSES This condition is often caused by:  A fall.  A hit to the shoulder.  A forceful movement of the shoulder. RISK FACTORS This condition is more likely to develop in people who play sports. SYMPTOMS Symptoms of this condition  include:  Deformity of the shoulder.  Intense pain.  Inability to move the shoulder.  Numbness, weakness, or tingling in your neck or down your arm.  Bruising or swelling around your shoulder. DIAGNOSIS This condition is diagnosed with a physical exam. After the exam, tests may be done to check for related problems. Tests that may be done include:  X-ray. This may be done to check for broken bones.  MRI. This may be done to check for damage to the tissues around the shoulder.  Electromyogram. This may be done to check for nerve damage. TREATMENT This condition is treated with a procedure to place the humerus back in the joint. This procedure is called a reduction. There are two types of reduction:  Closed reduction. In this procedure, the humerus is placed back in the joint without surgery. The health care provider uses his or her hands to guide the bone back into place.  Open reduction. In this procedure, the humerus is placed back in the joint with surgery. An open reduction may be recommended if:  You have a weak shoulder joint  or weak ligaments.  You have had more than one shoulder dislocation.  The nerves or blood vessels around your shoulder have been damaged. After the humerus is placed back into the joint, your arm will be placed in a splint or sling to prevent it from moving. You will need to wear the splint or sling until your shoulder heals. When the splint or sling is removed, you may have physical therapy to help improve the range of motion in your shoulder joint. HOME CARE INSTRUCTIONS If You Have a Splint or Sling:  Wear it as told by your health care provider. Remove it only as told by your health care provider.  Loosen it if your fingers become numb and tingle, or if they turn cold and blue.  Keep it clean and dry. Bathing  Do not take baths, swim, or use a hot tub until your health care provider approves. Ask your health care provider if you can take  showers. You may only be allowed to take sponge baths for bathing.  If your health care provider approves bathing and showering, cover your splint or sling with a watertight plastic bag to protect it from water. Do not let the splint or sling get wet. Managing Pain, Stiffness, and Swelling  If directed, apply ice to the injured area.  Put ice in a plastic bag.  Place a towel between your skin and the bag.  Leave the ice on for 20 minutes, 2-3 times per day.  Move your fingers often to avoid stiffness and to decrease swelling.  Raise (elevate) the injured area above the level of your heart while you are sitting or lying down. Driving  Do not drive while wearing a splint or sling on a hand that you use for driving.  Do not drive or operate heavy machinery while taking pain medicine. Activity  Return to your normal activities as told by your health care provider. Ask your health care provider what activities are safe for you.  Perform range-of-motion exercises only as told by your health care provider.  Exercise your hand by squeezing a soft ball. This helps to decrease stiffness and swelling in your hand and wrist. General Instructions  Take over-the-counter and prescription medicines only as told by your health care provider.  Do not use any tobacco products, including cigarettes, chewing tobacco, or e-cigarettes. Tobacco can delay bone and tissue healing. If you need help quitting, ask your health care provider.  Keep all follow-up visits as told by your health care provider. This is important. SEEK MEDICAL CARE IF:  Your splint or sling gets damaged. SEEK IMMEDIATE MEDICAL CARE IF:  Your pain gets worse rather than better.  You lose feeling in your arm or hand.  Your arm or hand becomes white and cold.   This information is not intended to replace advice given to you by your health care provider. Make sure you discuss any questions you have with your health care  provider.   Document Released: 06/26/2001 Document Revised: 06/22/2015 Document Reviewed: 01/24/2015 Elsevier Interactive Patient Education [employer]

## 2015-10-28 NOTE — Sedation Documentation (Signed)
Shoulder immobilizer applied to right shoulder by Dr Elesa MassedWard, xray at bedside for post reduction,  Cms remains intact

## 2015-10-28 NOTE — ED Provider Notes (Signed)
TIME SEEN: 3:50 AM  CHIEF COMPLAINT: Possible seizure, right shoulder pain  HPI: Pt is a 25 y.o. male with history of ADHD, 2 previous seizures not on antiepileptics, dislocation of the left shoulder and fracture of the humeral head requiring surgery by Dr. Romeo Apple in August 2016 who presents to the emergency department with complaints of a possible seizure tonight. States he was upstairs in his room smoking a cigarette. He states that his father heard him making noise and went upstairs to find him with decreased responsiveness. When he came to patient is complaining of right shoulder pain. He is right-hand dominant. He states he feels it is dislocated. States that he was told if he had a third seizure he would need to be started on antiepileptics. He has not yet seen a neurologist. No history of recent infection. States he did drink heavily every day but quit drinking 2-3 months ago. No illicit drug use. Is on Adderall for ADHD. Denies any other pain currently.  ROS: See HPI Constitutional: no fever  Eyes: no drainage  ENT: no runny nose   Cardiovascular:  no chest pain  Resp: no SOB  GI: no vomiting GU: no dysuria Integumentary: no rash  Allergy: no hives  Musculoskeletal: no leg swelling  Neurological: no slurred speech ROS otherwise negative  PAST MEDICAL HISTORY/PAST SURGICAL HISTORY:  Past Medical History  Diagnosis Date  . Depressed   . ADHD (attention deficit hyperactivity disorder)   . Dislocation of left shoulder joint   . Fracture of humeral head, left, closed     MEDICATIONS:  Prior to Admission medications   Medication Sig Start Date End Date Taking? Authorizing Provider  amphetamine-dextroamphetamine (ADDERALL) 10 MG tablet Take 10 mg by mouth daily with breakfast.   Yes Historical Provider, MD  escitalopram (LEXAPRO) 20 MG tablet Take 20 mg by mouth daily. 04/14/15   Historical Provider, MD  HYDROcodone-acetaminophen (NORCO/VICODIN) 5-325 MG tablet Take 1 tablet by  mouth every 6 (six) hours as needed for moderate pain. 10/04/15   Vickki Hearing, MD  ibuprofen (ADVIL,MOTRIN) 800 MG tablet Take 1 tablet (800 mg total) by mouth every 8 (eight) hours as needed. 06/03/15   Vickki Hearing, MD  STRATTERA 80 MG capsule Take 80 mg by mouth daily. 04/27/15   Historical Provider, MD    ALLERGIES:  No Known Allergies  SOCIAL HISTORY:  Social History  Substance Use Topics  . Smoking status: Current Every Day Smoker -- 1.00 packs/day  . Smokeless tobacco: Current User  . Alcohol Use: Yes    FAMILY HISTORY: No family history on file.  EXAM: BP 134/82 mmHg  Pulse 115  Temp(Src) 98.2 F (36.8 C) (Oral)  Resp 22  Ht 6\' 1"  (1.854 m)  Wt 225 lb (102.059 kg)  BMI 29.69 kg/m2  SpO2 96% CONSTITUTIONAL: Alert and oriented and responds appropriately to questions. Appears uncomfortable, diaphoretic HEAD: Normocephalic EYES: Conjunctivae clear, PERRL ENT: normal nose; no rhinorrhea; moist mucous membranes; pharynx without lesions noted, mild bilateral tonsillar hypertrophy without exudate, no stridor, no trismus or drooling, normal phonation NECK: Supple, no meningismus, no LAD  CARD: Regular and tachycardic; S1 and S2 appreciated; no murmurs, no clicks, no rubs, no gallops RESP: Normal chest excursion without splinting or tachypnea; breath sounds clear and equal bilaterally; no wheezes, no rhonchi, no rales, no hypoxia or respiratory distress, speaking full sentences ABD/GI: Normal bowel sounds; non-distended; soft, non-tender, no rebound, no guarding, no peritoneal signs BACK:  The back appears normal and is  non-tender to palpation, there is no CVA tenderness EXT: Loss of fullness of the right shoulder with tenderness over the shoulder and proximal humerus, unable to move this arm secondary to pain but there is full range of motion in the right elbow, right wrist and right fingers. 2+ radial pulses bilaterally. Normal sensation throughout the right upper  extremity. Otherwise Normal ROM in all joints; otherwise x-rays are non-tender to palpation; no edema; normal capillary refill; no cyanosis, no calf tenderness or swelling    SKIN: Normal color for age and race; warm NEURO: Moves all extremities equally, sensation to light touch intact diffusely, cranial nerves II through XII intact PSYCH: The patient's mood and manner are appropriate. Grooming and personal hygiene are appropriate.  MEDICAL DECISION MAKING: Patient here with third seizure in the past several months. Reports it is been witness once by a friend he described it as generalized tonic-clonic. He is not on antiepileptics. He has a right shoulder dislocation on exam and confirmed by x-ray. Neurovascularly intact. He hasn't had anything to eat since 7 PM yesterday evening. No allergies. We'll give Dilaudid for pain, sedate with propofol and reduce shoulder. Will obtain labs to evaluate for any organic cause for his seizure tonight. Anticipate discussion with neurology for recommendations of antiepileptics.  ED PROGRESS: 4:40 AM  Pt's right anterior shoulder dislocation was reduced using propofol. Neurovascularly intact afterwards. Labs pending.  4:55 AM  Pt's postreduction x-ray confirms reduction of this joint with no fracture. Labs show leukocytosis with left shift but suspect this reactive. He is not having any infectious symptoms. Will consult neurology on call for recommendations. Anticipate outpatient follow-up with neurology, orthopedics.  5:05 AM  D/w Dr. Hosie PoissonSumner with nor hospitalist who recommends placing patient on Keppra 500 mg twice daily. I will give him outpatient neurology follow-up information as well as follow-up with Dr. Romeo AppleHarrison with orthopedics who has performed his left shoulder surgery before. Have advised him to keep a shoulder in a sling at all times until cleared by orthopedics. Have also discussed with him that he should not drive or perform any activities that may lead to  injury to himself or others if he were to have another seizure. He states that because of his history of previous alcohol abuse he does not have a license and no longer is driving. We'll discharge with short prescription for pain medication. Discussed return precautions. Patient verbalized understanding, thankful for care.     Procedural sedation Performed by: Raelyn NumberWARD, KRISTEN N Consent: Verbal consent obtained. Risks and benefits: risks, benefits and alternatives were discussed Required items: required blood products, implants, devices, and special equipment available Patient identity confirmed: arm band and provided demographic data Time out: Immediately prior to procedure a "time out" was called to verify the correct patient, procedure, equipment, support staff and site/side marked as required.  Sedation type: moderate (conscious) sedation NPO time confirmed and considedered  Sedatives: PROPOFOL  Physician Time at Bedside: 25 minutes  Vitals: Vital signs were monitored during sedation. Cardiac Monitor, pulse oximeter Patient tolerance: Patient tolerated the procedure well with no immediate complications. Comments: Pt with uneventful recovered. Returned to pre-procedural sedation baseline     Reduction of dislocation Date/Time: 4:14 AM Performed by: Raelyn NumberWARD, KRISTEN N Authorized by: Raelyn NumberWARD, KRISTEN N Consent: Verbal consent obtained. Risks and benefits: risks, benefits and alternatives were discussed Consent given by: patient Required items: required blood products, implants, devices, and special equipment available Time out: Immediately prior to procedure a "time out" was called to verify  the correct patient, procedure, equipment, support staff and site/side marked as required.  Patient sedated: With 2 mg/kg of IV propofol  Vitals: Vital signs were monitored during sedation. Patient tolerance: Patient tolerated the procedure well with no immediate complications. Joint: Right  shoulder Reduction technique: Traction, flexion of the elbow, external rotation     SPLINT APPLICATION Date/Time: 4:44 AM Authorized by: Raelyn Number Consent: Verbal consent obtained. Risks and benefits: risks, benefits and alternatives were discussed Consent given by: patient Splint applied by: Myself and nurse Location details: Right arm  Splint type: Shoulder immobilizer  Supplies used: Shoulder immobilizer  Post-procedure: The splinted body part was neurovascularly unchanged following the procedure. Patient tolerance: Patient tolerated the procedure well with no immediate complications.      Layla Maw Ward, DO 10/28/15 [phone]

## 2015-10-28 NOTE — ED Notes (Signed)
Pt made aware to return if symptoms worsen or if any life threatening symptoms occur.   

## 2015-10-28 NOTE — ED Notes (Signed)
Pt states that he thinks he may have had a seizure. Per ems pt alert on their arrival, no mouth trauma or incontinence noted, pt c/o right shoulder pain states that he fell against the dresser, cms intact distal, deformity noted to right shoulder,

## 2015-10-28 NOTE — ED Notes (Signed)
Pt states that he is not able to get a ride now, instructed pt that he would need a ride home,

## 2015-11-08 ENCOUNTER — Ambulatory Visit: Payer: BLUE CROSS/BLUE SHIELD | Admitting: Orthopedic Surgery

## 2015-11-10 ENCOUNTER — Encounter: Payer: Self-pay | Admitting: Orthopedic Surgery

## 2015-11-10 ENCOUNTER — Ambulatory Visit (INDEPENDENT_AMBULATORY_CARE_PROVIDER_SITE_OTHER): Payer: BLUE CROSS/BLUE SHIELD | Admitting: Orthopedic Surgery

## 2015-11-10 VITALS — BP 130/80 | Ht 73.0 in | Wt 225.0 lb

## 2015-11-10 DIAGNOSIS — S43004A Unspecified dislocation of right shoulder joint, initial encounter: Secondary | ICD-10-CM

## 2015-11-10 MED ORDER — HYDROCODONE-ACETAMINOPHEN 7.5-325 MG PO TABS: 1.0000 | ORAL_TABLET | ORAL | Status: DC | PRN

## 2015-11-10 NOTE — Patient Instructions (Signed)
Okay to remove sling

## 2015-11-10 NOTE — Progress Notes (Signed)
Patient ID: George [name], male   DOB: December 04, 1990, 25 y.o.   MRN: [id number]  Chief Complaint  Patient presents with  . Shoulder Injury    er follow up Rt shoulder dislocation, DOI 10/28/15    Right shoulder pain HPI George [name] is a 25 y.o. male.  Soon to be 25 seizure cause right shoulder dislocation status post open treatment internal fixation left shoulder for dislocation and fracture  Complains of "a lot of pain 8 out of 10 constant aching sharp stiffness locking pain. Status post fall. Injury. Current meds hydrocodone Motrin Tylenol leave with some GI upset pain is worse with mobility and whether  He's been in a sling for 2 weeks  Review of Systems Review of Systems Recent weight loss muscle weakness stiffness in the joints back pain anxiety constipation Past Medical History  Diagnosis Date  . Depressed   . ADHD (attention deficit hyperactivity disorder)   . Dislocation of left shoulder joint   . Fracture of humeral head, left, closed     Past Surgical History  Procedure Laterality Date  . Orif humerus fracture Left 06/03/2015    Procedure: OPEN REDUCTION INTERNAL FIXATION LEFT PROXIMAL HUMERUS FRACTURE;  Surgeon: Vickki Hearing, MD;  Location: [facility];  Service: Orthopedics;  Laterality: Left;    No family history on file.  Social History Social History  Substance Use Topics  . Smoking status: Current Every Day Smoker -- 1.00 packs/day  . Smokeless tobacco: Current User  . Alcohol Use: Yes    No Known Allergies  Current Outpatient Prescriptions  Medication Sig Dispense Refill  . amphetamine-dextroamphetamine (ADDERALL) 10 MG tablet Take 10 mg by mouth daily with breakfast.    . HYDROcodone-acetaminophen (NORCO/VICODIN) 5-325 MG tablet Take 1-2 tablets by mouth every 6 (six) hours as needed. 20 tablet 0  . levETIRAcetam (KEPPRA) 500 MG tablet Take 1 tablet (500 mg total) by mouth 2 (two) times daily. 60 tablet 2  . [DISCONTINUED] escitalopram  (LEXAPRO) 20 MG tablet Take 20 mg by mouth daily.  10  . [DISCONTINUED] STRATTERA 80 MG capsule Take 80 mg by mouth daily.  10   No current facility-administered medications for this visit.       Physical Exam Physical Exam There were no vitals taken for this visit. Appearance, there are no abnormalities in terms of appearance the patient was well-developed and well-nourished. The grooming and hygiene were normal.  Mental status orientation, there was normal alertness and orientation Mood pleasant Ambulatory status normal with no assistive devices  Examination of the right shoulder Inspection tenderness and swelling around the shoulder joint no deformity Range of motion limited range of motion by pain external rotation at the side 30 and painful Tests for stability deferred Motor strength  muscle tone normal Skin warm dry and intact without laceration or ulceration or erythema Neurologic examination normal sensation Vascular examination normal pulses with warm extremity and normal capillary refill  The opposite extremity anterior scar limited external rotation limited flexion limited abduction normal internal rotation power    Data Reviewed I am reading pre-and post reduction x-rays anterior shoulder dislocation good reduction orthogonal views obtained.  Assessment  Right shoulder second time dislocation secondary to seizure no fracture   Plan  Remove sling Patient can return in about 2 weeks We can continue his Norco Reexamine shoulder in 2 weeks

## 2015-11-29 ENCOUNTER — Ambulatory Visit (INDEPENDENT_AMBULATORY_CARE_PROVIDER_SITE_OTHER): Payer: BLUE CROSS/BLUE SHIELD | Admitting: Orthopedic Surgery

## 2015-11-29 ENCOUNTER — Encounter: Payer: Self-pay | Admitting: Orthopedic Surgery

## 2015-11-29 VITALS — BP 140/84 | Ht 73.0 in | Wt 225.0 lb

## 2015-11-29 DIAGNOSIS — S43004D Unspecified dislocation of right shoulder joint, subsequent encounter: Secondary | ICD-10-CM | POA: Diagnosis not present

## 2015-11-29 DIAGNOSIS — M542 Cervicalgia: Secondary | ICD-10-CM

## 2015-11-29 DIAGNOSIS — M25512 Pain in left shoulder: Secondary | ICD-10-CM

## 2015-11-29 DIAGNOSIS — S42202D Unspecified fracture of upper end of left humerus, subsequent encounter for fracture with routine healing: Secondary | ICD-10-CM

## 2015-11-29 MED ORDER — HYDROCODONE-ACETAMINOPHEN 7.5-325 MG PO TABS: 1.0000 | ORAL_TABLET | ORAL | Status: DC | PRN

## 2015-11-29 MED ORDER — METHOCARBAMOL 500 MG PO TABS: 500.0000 mg | ORAL_TABLET | Freq: Four times a day (QID) | ORAL | Status: DC | PRN

## 2015-11-29 NOTE — Progress Notes (Signed)
This is a follow-up visit status post right shoulder dislocation on 10/28/2015 treated with shoulder immobilizer and active range of motion exercises  Patient now complains of increasing pain in his left shoulder which was treated with open treatment internal fixation for a greater tuberosity fracture  The right shoulder has resolved in terms of pain and motion deficits  Left shoulder pain along the medial scapula upper trapezius muscle and cervical spine  Review of Systems  Constitutional: Negative for fever and chills.  Musculoskeletal: Positive for myalgias and neck pain.  Skin: Negative for rash.  Neurological: Negative for tingling.   Past Medical History  Diagnosis Date  . Depressed   . ADHD (attention deficit hyperactivity disorder)   . Dislocation of left shoulder joint   . Fracture of humeral head, left, closed    BP 140/84 mmHg  Ht  (1.854 m)  Wt 225 lb (102.059 kg)  BMI 29.69 kg/m2 Physical Exam  Constitutional: He is oriented to person, place, and time and well-developed, well-nourished, and in no distress.  HENT:  Head: Normocephalic.  Eyes: Pupils are equal, round, and reactive to light.  Neck: Neck supple.  Tenderness at the base of the cervical spine decreased rotation right to left  Cardiovascular: Intact distal pulses.   Musculoskeletal:  Right shoulder no tenderness range of motion is returned to normal abduction external rotation no apprehension no instability rotator cuff strength normal.  Left shoulder external rotation 35 passive forward elevation 130 rotator cuff strength has returned to 5 minus over 5  Cervical spine tenderness and medial scapular tenderness as well as trapezius tenderness  Lymphadenopathy:    He has no cervical adenopathy.  Neurological: He is alert and oriented to person, place, and time. He has normal reflexes. He exhibits abnormal muscle tone.  Skin: Skin is dry. No rash noted. No erythema. No pallor.  Psychiatric: Mood,  memory, affect and judgment normal.   distal grip strength normal.

## 2015-11-29 NOTE — Patient Instructions (Signed)
CALL [facility] TO SCHEDULE THERAPY VISITS  ONE NEW MED SENT TO YOUR PHARMACY

## 2015-12-01 ENCOUNTER — Telehealth: Payer: Self-pay | Admitting: Orthopedic Surgery

## 2015-12-01 NOTE — Telephone Encounter (Signed)
Patient called to relay that the prescribed medication: HYDROcodone-acetaminophen (NORCO) 7.5-325 MG is not helping with his pain.  He was seen here on 11/29/15, and is states that "it seems like since the second injury, the medication seems like it's not doing as much."  Please advise.  Ph# [phone].

## 2015-12-01 NOTE — Telephone Encounter (Signed)
Routing to Dr Harrison for review 

## 2015-12-02 NOTE — Telephone Encounter (Signed)
NO CHANGE IN MEDICATION

## 2015-12-05 NOTE — Telephone Encounter (Signed)
I relayed this response to patient when he called in to follow up.  States nothing is relieving the pain, said he has tried hot showers, and also Aspercreme, along with his prescribed medication, and no relief.  Any other recommendations?  Please advise patient.

## 2015-12-06 NOTE — Telephone Encounter (Signed)
NO OTHER RECOMMEDATIONS

## 2015-12-07 ENCOUNTER — Ambulatory Visit (HOSPITAL_COMMUNITY): Payer: BLUE CROSS/BLUE SHIELD | Attending: Orthopedic Surgery

## 2015-12-07 ENCOUNTER — Encounter (HOSPITAL_COMMUNITY): Payer: Self-pay

## 2015-12-07 DIAGNOSIS — M436 Torticollis: Secondary | ICD-10-CM | POA: Insufficient documentation

## 2015-12-07 DIAGNOSIS — M629 Disorder of muscle, unspecified: Secondary | ICD-10-CM | POA: Insufficient documentation

## 2015-12-07 DIAGNOSIS — M25512 Pain in left shoulder: Secondary | ICD-10-CM | POA: Insufficient documentation

## 2015-12-07 DIAGNOSIS — M6289 Other specified disorders of muscle: Secondary | ICD-10-CM

## 2015-12-07 DIAGNOSIS — M25612 Stiffness of left shoulder, not elsewhere classified: Secondary | ICD-10-CM | POA: Insufficient documentation

## 2015-12-07 DIAGNOSIS — R29898 Other symptoms and signs involving the musculoskeletal system: Secondary | ICD-10-CM

## 2015-12-07 NOTE — Patient Instructions (Signed)
  Flexibility: Corner Stretch   Standing in corner with hands just above shoulder level lean forward until a comfortable stretch is felt across chest. Hold _10___ seconds. Repeat __3__ times per set. Do ____ sets per session. Do ____ sessions per day.  http://orth.exer.us/342   Copyright  VHI. All rights reserved.   Scapular Retraction (Standing)   With arms at sides, pinch shoulder blades together. Repeat _10___ times per set. Do __3__ sets per session. Do ____ sessions per day.  http://orth.exer.us/944   Copyright  VHI. All rights reserved.   ROM: Towel Stretch - with Interior Rotation   Pull left arm up behind back by pulling towel up with other arm. Hold _10___ seconds. Repeat _3___ times per set. Do ____ sets per session. Do ____ sessions per day.  http://orth.exer.us/888   Copyright  VHI. All rights reserved.   Posterior Capsule Stretch   Stand or sit, one arm across body so hand rests over opposite shoulder. Gently push on crossed elbow with other hand until stretch is felt in shoulder of crossed arm. Hold _10__ seconds.  Repeat _3__ times per session. Do ___ sessions per day.     Flexors Stretch, Standing   Stand near wall and slide arm up, with palm facing away from wall, by leaning toward wall. Hold _10__ seconds.  Repeat _3__ times per session. Do ___ sessions per day.   Side Bend, Standing   Stand, one hand grasping other arm above wrist behind body. Pull down while gently tilting head toward pulling side. Hold _10__ seconds. Repeat _3__ times per session. Do ___ sessions per day.

## 2015-12-07 NOTE — Therapy (Signed)
[facility] [address] [place], [place], [address] Phone: [phone]   Fax:  [phone]  Occupational Therapy Evaluation  Patient Details  Name: George [name] MRN: [id number] Date of Birth: 1991-04-08 Referring Provider: Ty Hilts, MD  Encounter Date: 12/07/2015      OT End of Session - 12/07/15 1343    Visit Number 1   Number of Visits 12   Date for OT Re-Evaluation 02/05/16  mini reassess: 01/04/16   Authorization Type [employer] - 25 visit limit. 8 used.    Authorization - Visit Number 9   Authorization - Number of Visits 25   OT Start Time 1300   OT Stop Time 1345   OT Time Calculation (min) 45 min   Activity Tolerance Patient tolerated treatment well   Behavior During Therapy WFL for tasks assessed/performed      Past Medical History  Diagnosis Date  . Depressed   . ADHD (attention deficit hyperactivity disorder)   . Dislocation of left shoulder joint   . Fracture of humeral head, left, closed     Past Surgical History  Procedure Laterality Date  . Orif humerus fracture Left 06/03/2015    Procedure: OPEN REDUCTION INTERNAL FIXATION LEFT PROXIMAL HUMERUS FRACTURE;  Surgeon: Vickki Hearing, MD;  Location: [facility];  Service: Orthopedics;  Laterality: Left;    There were no vitals filed for this visit.  Visit Diagnosis:  Pain in joint, shoulder region, left - Plan: Ot plan of care cert/re-cert  Tight fascia - Plan: Ot plan of care cert/re-cert  Shoulder stiffness, left - Plan: Ot plan of care cert/re-cert  Shoulder weakness - Plan: Ot plan of care cert/re-cert  Stiffness of cervical spine - Plan: Ot plan of care cert/re-cert      Subjective Assessment - 12/07/15 1307    Subjective  S: In the morning I am really stiff especially in my neck.   Pertinent History Patient is a 25 y/o male S/P left shoulder and cervical pain which occured after having a seizure on 10/28/15. Pt states he may have fell on it as  he did dislocate his right shoulder which is healed now. Dr. Romeo Apple has referred patient to occupational therapy for evaluation and treatment.    Repetition Increases Symptoms   Special Tests FOTO score: 40/100   Patient Stated Goals To be able to use his arm normally.    Currently in Pain? Yes   Pain Score 8    Pain Location Shoulder   Pain Orientation Left   Pain Descriptors / Indicators Sore           [facility] OT Assessment - 12/07/15 1257    Assessment   Diagnosis neck and left shoulder pain   Referring Provider Ty Hilts, MD   Precautions   Precautions None   Restrictions   Weight Bearing Restrictions No   Balance Screen   Has the patient fallen in the past 6 months Yes   How many times? 1   Has the patient had a decrease in activity level because of a fear of falling?  No   Is the patient reluctant to leave their home because of a fear of falling?  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    Mobility   Mobility Status Independent   Written Expression   Dominant Hand Right   Cognition   Overall Cognitive Status Within Functional Limits for tasks assessed   ROM / Strength   AROM / PROM / Strength AROM;PROM;Strength   Palpation   Palpation comment Max fascial restrictions in left upper arm, trapezius, and scapularis region.    AROM   Overall AROM Comments Assessed supine. IR/er adducted.   AROM Assessment Site Shoulder;Cervical   Right/Left Shoulder Left   Left Shoulder Flexion 105 Degrees  Supine: 145   Left Shoulder ABduction 75 Degrees  supine: 115   Left Shoulder Internal Rotation 90 Degrees   Left Shoulder External Rotation 39 Degrees  supine: 42    Cervical Flexion 90   Cervical Extension 46   Cervical - Right Side Bend 60   Cervical - Left Side Bend 50   Cervical - Right Rotation 90   Cervical - Left Rotation 90   PROM   PROM Assessment Site Shoulder   Right/Left Shoulder Left   Left Shoulder Flexion 160 Degrees   Left Shoulder ABduction 130 Degrees   Left Shoulder Internal Rotation 90 Degrees   Left Shoulder External Rotation 45 Degrees   Strength   Overall Strength Comments Assessed standing. IR/er adducted   Strength Assessment Site Shoulder   Right/Left Shoulder Left   Left Shoulder Flexion 3-/5   Left Shoulder ABduction 3-/5   Left Shoulder Internal Rotation 3/5   Left Shoulder External Rotation 3-/5            OT Education - 12/07/15 1342    Education provided Yes   Education Details Shoulder stretches   Person(s) Educated Patient   Methods Explanation;Demonstration;Handout;Verbal cues   Comprehension Returned demonstration;Verbalized understanding          OT Short Term Goals - 12/07/15 1348    OT SHORT TERM GOAL #1   Title Paitent will be educated and independent with HEP to increase functional mobility in LUE during daily tasks and be able to return to work.    Time 3   Period Weeks   Status New   OT SHORT TERM GOAL #2   Title Patient will increase A/ROM to [facility] to increase ability to complete tasks above shoulder height.    Time 3   Period Weeks   Status New   OT SHORT TERM GOAL #3   Title Patient will increase strength in LUE to 4-/5 to increase ability to lift lightweight items with arms close to body.   Time 3   Period Weeks   Status New   OT SHORT TERM GOAL #4   Title Patient will decrease pain level during daily tasks to 5/10 to increase ability to complete daily tasks using LUE.    Time 3   Period Weeks   Status New   OT SHORT TERM GOAL #5   Title Patient will decrease fascial restrictions to Mod amount in LUE to increase functional mobility   Time 3   Period Weeks   Status New            OT Long Term Goals - 12/07/15 1350    OT LONG TERM GOAL #1   Title patient will return to highest level of independence with all daily and leisure activties and be able to return to somet ype of job.   Time 6  Period Weeks   Status New   OT LONG TERM GOAL #2   Title Patient will increase LUE strength to 4+/5 to increase ability to complete all daily activities with LUE as non dominant.    Time 6   Period Weeks   Status New   OT LONG TERM GOAL #3   Title Patient will increase cervical extension by 5-10 degrees with report of less pain.   Time 6   Period Weeks   Status New   OT LONG TERM GOAL #4   Title Patient will decrease fascial restrictions to min amount.    Time 6   Period Weeks   Status New   OT LONG TERM GOAL #5   Title Patient will decrease pain level during daily tasks to 3/10 or less.    Time 6   Period Weeks   Status New               Plan - 12/07/15 1344    Clinical Impression Statement A: Patient is a 25 y/o male S/P left shoulder and cervical pain causing decreased ROM and strength and increased pain and fascial restrictions resulting in difficulty completing ADl tasks with LUE.   Pt will benefit from skilled therapeutic intervention in order to improve on the following deficits (Retired) Decreased strength;Pain;Impaired UE functional use;Increased edema;Decreased range of motion;Increased fascial restricitons   Rehab Potential Good   OT Frequency 2x / week   OT Duration 6 weeks   OT Treatment/Interventions Ultrasound;Self-care/ADL training;DME and/or AE instruction;Cryotherapy;Electrical Stimulation;Moist Heat;Therapeutic activities;Therapeutic exercises;Manual Therapy;Patient/family education;Passive range of motion   Plan P: Patient will benefit from skilled OT services to increase functional performance during daily tasks using LUE. Treatment Plan: Myofascial release, manual stretching, A/ROM, scapular and shoulder strengthening.     Consulted and Agree with Plan of Care Patient        Problem List Patient Active Problem List   Diagnosis Date Noted  . Fracture of greater tuberosity of left humerus   . CLOSED FRACTURE OF HEAD OF RADIUS 02/24/2008    Limmie Patricia, OTR/L,CBIS  [phone]  12/07/2015, 1:55 PM  [facility] [address] [place], [place], [address] Phone: [phone]   Fax:  [phone]  Name: George [name] MRN: [id number] Date of Birth: [birth year]

## 2015-12-08 NOTE — Telephone Encounter (Signed)
Called back to patient and relayed. States he has been doing a little better with doing stretches.  States does take Tylenol or Aleve in between doses of pain medication, which has also helped a little. Patient relays he also has recently seen his primary care doctor, who is managing his other medications.

## 2015-12-16 ENCOUNTER — Ambulatory Visit (HOSPITAL_COMMUNITY): Payer: BLUE CROSS/BLUE SHIELD

## 2015-12-20 ENCOUNTER — Ambulatory Visit: Payer: BLUE CROSS/BLUE SHIELD | Admitting: Orthopedic Surgery

## 2015-12-21 ENCOUNTER — Encounter (HOSPITAL_COMMUNITY): Payer: BLUE CROSS/BLUE SHIELD | Admitting: Specialist

## 2015-12-21 ENCOUNTER — Encounter: Payer: Self-pay | Admitting: Orthopedic Surgery

## 2015-12-21 ENCOUNTER — Ambulatory Visit (INDEPENDENT_AMBULATORY_CARE_PROVIDER_SITE_OTHER): Payer: BLUE CROSS/BLUE SHIELD | Admitting: Orthopedic Surgery

## 2015-12-21 ENCOUNTER — Ambulatory Visit (INDEPENDENT_AMBULATORY_CARE_PROVIDER_SITE_OTHER): Payer: BLUE CROSS/BLUE SHIELD

## 2015-12-21 VITALS — BP 124/80 | HR 73 | Ht 73.0 in | Wt 225.0 lb

## 2015-12-21 DIAGNOSIS — M6249 Contracture of muscle, multiple sites: Secondary | ICD-10-CM

## 2015-12-21 DIAGNOSIS — M62838 Other muscle spasm: Secondary | ICD-10-CM

## 2015-12-21 DIAGNOSIS — M542 Cervicalgia: Secondary | ICD-10-CM

## 2015-12-21 MED ORDER — PREDNISONE 10 MG (48) PO TBPK: ORAL_TABLET | Freq: Every day | ORAL | Status: DC

## 2015-12-21 MED ORDER — HYDROCODONE-ACETAMINOPHEN 5-325 MG PO TABS: 1.0000 | ORAL_TABLET | Freq: Three times a day (TID) | ORAL | Status: DC | PRN

## 2015-12-21 MED ORDER — DIAZEPAM 5 MG PO TABS: 5.0000 mg | ORAL_TABLET | Freq: Four times a day (QID) | ORAL | Status: DC | PRN

## 2015-12-21 NOTE — Progress Notes (Signed)
Chief Complaint  Patient presents with  . Follow-up    follow up neck and shoulders    The patient dislocated his right shoulder most recently. He had a open treatment internal fixation of a greater tuberosity fracture on the left basically comes in now complaining of neck and back pain. We treated him in the past with Robaxin and he was on hydrocodone and ibuprofen  He is now on Tylenol and Advil and is having significant pain running down his left arm  We will take an x-ray of his neck  Review of systems no fever or chills no generalized malaise. No shortness of breath skin normal denies numbness or tingling sedimentation rate did have some trouble opening closing his hand today.  Past Medical History  Diagnosis Date  . Depressed   . ADHD (attention deficit hyperactivity disorder)   . Dislocation of left shoulder joint   . Fracture of humeral head, left, closed     BP 124/80 mmHg  Pulse 73  Ht 6\' 1"  (1.854 m)  Wt 225 lb (102.059 kg)  BMI 29.69 kg/m2  Tenderness in the cervical spine decreased range of motion tenderness in the medial scapula tenderness in the mid thoracic region. Stiffness in the left shoulder from the left shoulder surgery and incomplete rehabilitation. Right shoulder range of motion has returned to normal mild crepitance strength in the right arm is normal. Skin in the Shoulder areas back normal. Pulse and perfusion normal bilaterally lymph nodes are negative bilaterally sensation on the right is normal and the left is normal reflexes are normal  X-rays show no unusual findings there may be just some slight arthritis in the mid cervical region but the contour the neck is normal in terms of alignment  No disc space narrowing.  Impression muscle spasm neck pain  Recommend Valium for muscle spasm Steroid for inflammation Norco for pain  Trial of 4 weeks of treatment

## 2015-12-26 ENCOUNTER — Ambulatory Visit (HOSPITAL_COMMUNITY): Payer: BLUE CROSS/BLUE SHIELD | Attending: Orthopedic Surgery

## 2015-12-26 DIAGNOSIS — M25512 Pain in left shoulder: Secondary | ICD-10-CM | POA: Insufficient documentation

## 2015-12-26 DIAGNOSIS — M629 Disorder of muscle, unspecified: Secondary | ICD-10-CM | POA: Diagnosis not present

## 2015-12-26 DIAGNOSIS — R29898 Other symptoms and signs involving the musculoskeletal system: Secondary | ICD-10-CM | POA: Insufficient documentation

## 2015-12-26 DIAGNOSIS — M25612 Stiffness of left shoulder, not elsewhere classified: Secondary | ICD-10-CM | POA: Insufficient documentation

## 2015-12-26 DIAGNOSIS — M6289 Other specified disorders of muscle: Secondary | ICD-10-CM

## 2015-12-26 NOTE — Therapy (Signed)
[facility] [address] [place], [place], [address] Phone: [phone]   Fax:  [phone]  Occupational Therapy Treatment  Patient Details  Name: George [name] MRN: [id number] Date of Birth: Dec 20, 1990 Referring Provider: Ty HiltsStanely Harrison, MD  Encounter Date: 12/26/2015      OT End of Session - 12/26/15 1548    Visit Number 2   Number of Visits 12   Date for OT Re-Evaluation 02/05/16  mini reassess: 01/04/16   Authorization Type BCBS - 25 visit limit. 8 used.    Authorization - Visit Number 10   Authorization - Number of Visits 25   OT Start Time 1435   OT Stop Time 1515   OT Time Calculation (min) 40 min   Activity Tolerance Patient tolerated treatment well   Behavior During Therapy WFL for tasks assessed/performed      Past Medical History  Diagnosis Date  . Depressed   . ADHD (attention deficit hyperactivity disorder)   . Dislocation of left shoulder joint   . Fracture of humeral head, left, closed     Past Surgical History  Procedure Laterality Date  . Orif humerus fracture Left 06/03/2015    Procedure: OPEN REDUCTION INTERNAL FIXATION LEFT PROXIMAL HUMERUS FRACTURE;  Surgeon: Vickki HearingStanley E Harrison, MD;  Location: [facility];  Service: Orthopedics;  Laterality: Left;    There were no vitals filed for this visit.  Visit Diagnosis:  Tight fascia  Shoulder weakness  Shoulder stiffness, left      Subjective Assessment - 12/26/15 1454    Subjective  S: I've been really stretching it out at home.    Currently in Pain? No/denies            [facility] - 12/26/15 1456    Assessment   Diagnosis neck and left shoulder pain   Precautions   Precautions None                  OT Treatments/Exercises (OP) - 12/26/15 1456    Exercises   Exercises Shoulder;Neck   Neck Exercises: Prone   Other Prone Exercise neck extension; 10X   Shoulder Exercises: Supine   Protraction PROM;5  reps;Strengthening;12 reps   Protraction Weight (lbs) 1   Horizontal ABduction PROM;5 reps;Strengthening;12 reps   Horizontal ABduction Weight (lbs) 1   External Rotation PROM;5 reps;Strengthening;12 reps   External Rotation Weight (lbs) 1   Internal Rotation PROM;5 reps;Strengthening;12 reps   Internal Rotation Weight (lbs) 1   Flexion PROM;5 reps;Strengthening;12 reps   Shoulder Flexion Weight (lbs) 1   ABduction PROM;5 reps;Strengthening;12 reps   Shoulder ABduction Weight (lbs) 1   Shoulder Exercises: Prone   Other Prone Exercises Hughston exercises; 10X   Other Prone Exercises scapular raises; 10X   Manual Therapy   Manual Therapy Myofascial release;Muscle Energy Technique   Manual therapy comments manual therapy completed prior measurements and exercises.   Myofascial Release Myofascial release and manual stretching to left upper arm, trapezius, and scapularis region to decrease fascial restrictions and increase joint mobility in a pain free zone.    Muscle Energy Technique Muscle energy technique to left anterior deltoid to decrease muscle spasm and increase joint ROM.                OT Education - 12/26/15 1546    Education provided Yes   Education Details External rotation stretch and hughston exercises   Person(s) Educated Patient   Methods Explanation;Demonstration;Verbal cues;Handout   Comprehension Verbalized understanding;Returned  demonstration          OT Short Term Goals - 12/26/15 1455    OT SHORT TERM GOAL #1   Title Paitent will be educated and independent with HEP to increase functional mobility in LUE during daily tasks and be able to return to work.    Time 3   Period Weeks   Status On-going   OT SHORT TERM GOAL #2   Title Patient will increase A/ROM to [facility] to increase ability to complete tasks above shoulder height.    Time 3   Period Weeks   Status On-going   OT SHORT TERM GOAL #3   Title Patient will increase strength in LUE to 4-/5 to  increase ability to lift lightweight items with arms close to body.   Time 3   Period Weeks   Status On-going   OT SHORT TERM GOAL #4   Title Patient will decrease pain level during daily tasks to 5/10 to increase ability to complete daily tasks using LUE.    Time 3   Period Weeks   Status On-going   OT SHORT TERM GOAL #5   Title Patient will decrease fascial restrictions to Mod amount in LUE to increase functional mobility   Time 3   Period Weeks   Status On-going           OT Long Term Goals - 12/26/15 1455    OT LONG TERM GOAL #1   Title patient will return to highest level of independence with all daily and leisure activties and be able to return to somet ype of job.   Time 6   Period Weeks   Status On-going   OT LONG TERM GOAL #2   Title Patient will increase LUE strength to 4+/5 to increase ability to complete all daily activities with LUE as non dominant.    Time 6   Period Weeks   Status On-going   OT LONG TERM GOAL #3   Title Patient will increase cervical extension by 5-10 degrees with report of less pain.   Time 6   Period Weeks   Status On-going   OT LONG TERM GOAL #4   Title Patient will decrease fascial restrictions to min amount.    Time 6   Period Weeks   Status On-going   OT LONG TERM GOAL #5   Title Patient will decrease pain level during daily tasks to 3/10 or less.    Time 6   Period Weeks   Status On-going               Plan - 12/26/15 1549    Clinical Impression Statement A: initiated myofascial release, muscle energy technique and strengthening for scapular and shoulders. VC for form and technique. External rotation extremely limited although was able to gain some range with muscle energy technique.   Plan P: Progress to 2# weight if able to tolerate. Add X to V arms and UBE bike.         Problem List Patient Active Problem List   Diagnosis Date Noted  . Fracture of greater tuberosity of left humerus   . CLOSED FRACTURE OF  HEAD OF RADIUS 02/24/2008    Limmie Patricia, OTR/L,CBIS  [phone]  12/26/2015, 3:52 PM  [facility] [address] [place], [place], [address] Phone: [phone]   Fax:  [phone]  Name: George [name] MRN: [id number] Date of Birth: 1990-12-08

## 2015-12-26 NOTE — Patient Instructions (Signed)
Hughston's- 90 Palm Down  Lay on stomach with arm hanging off of the edge. Relax muscles in neck. First, keeping elbow straight, squeeze shoulder blade back (towards the opposite one). Second, lift arm straight out to side with palm down and elbow straight, all while keeping shoulder blade back. Slowly return to relaxed, hanging position. 10 times. 1 set  Hughston's- 90 Thumb Up  Lay on stomach with arm hanging off of the edge. Relax muscles in neck. Keeping elbow straight, lift arm straight out to side with thumb up and pull shoulder blade back. Slowly return to relaxed, hanging position.   10 times. 1 set   EXTERNAL ROTATION STRETCH  Place arm along edge of door as pictured.  Keep elbow and shoulder at 90 degree angles.  Gently lean the upper torso forward until a stretch is felt in the shoulder. Hold __10_ seconds; relax; repeat

## 2015-12-30 ENCOUNTER — Telehealth (HOSPITAL_COMMUNITY): Payer: Self-pay

## 2015-12-30 ENCOUNTER — Ambulatory Visit (HOSPITAL_COMMUNITY): Payer: BLUE CROSS/BLUE SHIELD

## 2015-12-30 NOTE — Telephone Encounter (Signed)
He just had a death in  the family and will not be here tomorrow. Nf

## 2016-01-04 ENCOUNTER — Ambulatory Visit (HOSPITAL_COMMUNITY): Payer: BLUE CROSS/BLUE SHIELD | Admitting: Specialist

## 2016-01-04 DIAGNOSIS — M629 Disorder of muscle, unspecified: Secondary | ICD-10-CM

## 2016-01-04 DIAGNOSIS — R29898 Other symptoms and signs involving the musculoskeletal system: Secondary | ICD-10-CM

## 2016-01-04 DIAGNOSIS — M25512 Pain in left shoulder: Secondary | ICD-10-CM

## 2016-01-04 DIAGNOSIS — M6289 Other specified disorders of muscle: Secondary | ICD-10-CM

## 2016-01-04 DIAGNOSIS — M25612 Stiffness of left shoulder, not elsewhere classified: Secondary | ICD-10-CM

## 2016-01-04 NOTE — Therapy (Signed)
[facility] [address] [place], [place], [address] Phone: [phone]   Fax:  [phone]  Occupational Therapy Treatment  Patient Details  Name: George [name] MRN: [id number] Date of Birth: 04-Jul-1991 Referring Provider: Ty Hilts, MD  Encounter Date: 01/04/2016      OT End of Session - 01/04/16 1424    Visit Number 3   Number of Visits 12   Date for OT Re-Evaluation 02/05/16   Authorization Type BCBS - 25 visit limit. 8 used.    Authorization - Visit Number 11   Authorization - Number of Visits 25   OT Start Time 1307   OT Stop Time 1350   OT Time Calculation (min) 43 min   Activity Tolerance Patient tolerated treatment well   Behavior During Therapy WFL for tasks assessed/performed      Past Medical History  Diagnosis Date  . Depressed   . ADHD (attention deficit hyperactivity disorder)   . Dislocation of left shoulder joint   . Fracture of humeral head, left, closed     Past Surgical History  Procedure Laterality Date  . Orif humerus fracture Left 06/03/2015    Procedure: OPEN REDUCTION INTERNAL FIXATION LEFT PROXIMAL HUMERUS FRACTURE;  Surgeon: Vickki Hearing, MD;  Location: [facility];  Service: Orthopedics;  Laterality: Left;    There were no vitals filed for this visit.  Visit Diagnosis:  Tight fascia  Shoulder weakness  Shoulder stiffness, left  Pain in joint, shoulder region, left      Subjective Assessment - 01/04/16 1419    Subjective  S:  I may be doing too many exercises at home.     Special Tests FOTO is 60/100 was 40/100   Currently in Pain? Yes   Pain Score 6    Pain Location Shoulder   Pain Orientation Left   Pain Descriptors / Indicators Sore   Pain Type Acute pain   Pain Radiating Towards elbow   Pain Frequency Intermittent   Aggravating Factors  activity   Pain Relieving Factors rest            [facility] OT Assessment - 01/04/16 0001    Assessment   Diagnosis neck and  left shoulder pain   Precautions   Precautions --  seizures   Restrictions   Weight Bearing Restrictions No   ADL   ADL comments improved ability to reach to shoulder height.  continues to have diffiuclty reaching overhead and into external rotation to comb his hair.  difficulty lifting heavy items and has not returned to work    Observation/Other Assessments   Focus on Therapeutic Outcomes (FOTO)  FOTO score is 53% was 40%l   AROM   Overall AROM Comments Assessed supine. IR/er adducted.   Left Shoulder Flexion 164 Degrees  105   Left Shoulder ABduction 139 Degrees  75   Left Shoulder Internal Rotation 90 Degrees  90   Left Shoulder External Rotation 43 Degrees  39   PROM   Left Shoulder Flexion 165 Degrees  160   Left Shoulder ABduction 171 Degrees  130   Left Shoulder Internal Rotation 90 Degrees  90   Left Shoulder External Rotation 40 Degrees  45   Strength   Overall Strength Comments Assessed standing. IR/er adducted   Left Shoulder Flexion 3+/5   Left Shoulder ABduction 3+/5   Left Shoulder Internal Rotation 4-/5   Left Shoulder External Rotation 4-/5  OT Treatments/Exercises (OP) - 01/04/16 0001    Shoulder Exercises: Supine   Protraction PROM;5 reps   Horizontal ABduction PROM;5 reps   External Rotation PROM;5 reps   Internal Rotation PROM;5 reps   Flexion PROM;5 reps   ABduction PROM;5 reps   Shoulder Exercises: Standing   Extension Theraband;10 reps   Theraband Level (Shoulder Extension) Level 2 (Red)   Row Theraband;10 reps   Theraband Level (Shoulder Row) Level 2 (Red)   Retraction Theraband;10 reps   Theraband Level (Shoulder Retraction) Level 2 (Red)   Shoulder Exercises: ROM/Strengthening   UBE (Upper Arm Bike) 3 minutes in reverse at 1.0 resistance   Manual Therapy   Manual Therapy Myofascial release   Manual therapy comments manual therapy completed prior measurements and exercises.   Myofascial Release Myofascial  release and manual stretching to left upper arm, trapezius, and scapularis region to decrease fascial restrictions and increase joint mobility in a pain free zone.                   OT Short Term Goals - 01/04/16 1329    OT SHORT TERM GOAL #1   Title Paitent will be educated and independent with HEP to increase functional mobility in LUE during daily tasks and be able to return to work.    Time 3   Period Weeks   Status On-going   OT SHORT TERM GOAL #2   Title Patient will [facility] to [facility] to increase ability to complete tasks above shoulder height.    Time 3   Period Weeks   Status Achieved   OT SHORT TERM GOAL #3   Title Patient will increase strength in LUE to 4-/5 to increase ability to lift lightweight items with arms close to body.   Time 3   Period Weeks   Status On-going   OT SHORT TERM GOAL #4   Title Patient will decrease pain level during daily tasks to 5/10 to increase ability to complete daily tasks using LUE.    Time 3   Period Weeks   Status On-going   OT SHORT TERM GOAL #5   Title Patient will decrease fascial restrictions to Mod amount in LUE to increase functional mobility   Time 3   Period Weeks   Status Achieved           OT Long Term Goals - 01/04/16 1332    OT LONG TERM GOAL #1   Title patient will return to highest level of independence with all daily and leisure activties and be able to return to somet ype of job.   Time 6   Period Weeks   Status On-going   OT LONG TERM GOAL #2   Title Patient will increase LUE strength to 4+/5 to increase ability to complete all daily activities with LUE as non dominant.    Time 6   Period Weeks   Status On-going   OT LONG TERM GOAL #3   Title Patient will increase cervical extension by 5-10 degrees with report of less pain.   Time 6   Period Weeks   Status On-going   OT LONG TERM GOAL #4   Title Patient will decrease fascial restrictions to min amount.    Time 6   Period Weeks   Status  On-going   OT LONG TERM GOAL #5   Title Patient will decrease pain level during daily tasks to 3/10 or less.    Time 6   Period Weeks  Status On-going               Plan - 01/04/16 1428    Clinical Impression Statement A:  Patient has made improvements in A/ROM and strength.  His pain level remains high and continues to have deficits reaching behind his head and above shoulder height with his left arm.  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  f   Pt will benefit from skilled therapeutic intervention in order to improve on the following deficits (Retired) Decreased strength;Pain;Impaired UE functional use;Increased edema;Decreased range of motion;Increased fascial restricitons   OT Treatment/Interventions Ultrasound;Self-care/ADL training;DME and/or AE instruction;Cryotherapy;Electrical Stimulation;Moist Heat;Therapeutic activities;Therapeutic exercises;Manual Therapy;Patient/family education;Passive range of motion   Plan P:  Continue skilled OT intervention 2 times a week for 4 weeks.  Issue scapular theraband for HEP and focus on end range A/ROM flexion and improving A/ROM external rotation needed to reach behind his head with functional activites.   Consulted and Agree with Plan of Care Patient        Problem List Patient Active Problem List   Diagnosis Date Noted  . Fracture of greater tuberosity of left humerus   . CLOSED FRACTURE OF HEAD OF RADIUS 02/24/2008    Shirlean Mylar, OTR/L [phone]  01/04/2016, 3:03 PM  [facility] [address] [place], [place], [address] Phone: [phone]   Fax:  [phone]  Name: George [name] MRN: [id number] Date of Birth: May 25, 1991

## 2016-01-05 ENCOUNTER — Ambulatory Visit (HOSPITAL_COMMUNITY): Payer: BLUE CROSS/BLUE SHIELD | Admitting: Specialist

## 2016-01-05 DIAGNOSIS — M6289 Other specified disorders of muscle: Secondary | ICD-10-CM

## 2016-01-05 DIAGNOSIS — M629 Disorder of muscle, unspecified: Secondary | ICD-10-CM

## 2016-01-05 DIAGNOSIS — M25512 Pain in left shoulder: Secondary | ICD-10-CM

## 2016-01-05 DIAGNOSIS — M25612 Stiffness of left shoulder, not elsewhere classified: Secondary | ICD-10-CM

## 2016-01-05 DIAGNOSIS — R29898 Other symptoms and signs involving the musculoskeletal system: Secondary | ICD-10-CM

## 2016-01-05 NOTE — Patient Instructions (Signed)
Complete each exercise 15 times each 2-3 times per day   (Home) Extension: Isometric / Bilateral Arm Retraction - Sitting   Facing anchor, hold hands and elbow at shoulder height, with elbow bent.  Pull arms back to squeeze shoulder blades together. Repeat 10-15 times.  Copyright  VHI. All rights reserved.   (Home) Retraction: Row - Bilateral (Anchor)   Facing anchor, arms reaching forward, pull hands toward stomach, keeping elbows bent and at your sides and pinching shoulder blades together. Repeat 10-15 times.  Copyright  VHI. All rights reserved.   (Clinic) Extension / Flexion (Assist)   Face anchor, pull arms back, keeping elbow straight, and squeze shoulder blades together. Repeat 10-15 times.   Copyright  VHI. All rights reserved.  Table Stretch: External Rotation    Sit with left arm on table. Lean forward until a stretch is felt in shoulder. Hold __15__ seconds. Repeat ___5_ times. Do __3__ sessions per day.  http://gt2.exer.us/105   Copyright  VHI. All rights reserved.  Strengthening: Resisted Internal Rotation    Hold tubing in left hand, elbow at side and forearm out. Rotate forearm in across body. Repeat ____ times per set. Do ____ sets per session. Do ____ sessions per day.  http://orth.exer.us/830   Copyright  VHI. All rights reserved.  External Rotation: Single Arm (Cable)    Arm across body, rotate arm away from torso, keeping upper arm against body. Do ____ sets. Complete ____ repetitions.  http://st.exer.us/534   Copyright  VHI. All rights reserved.

## 2016-01-05 NOTE — Therapy (Signed)
[facility] [address] [place], [place], [address] Phone: [phone]   Fax:  [phone]  Occupational Therapy Treatment  Patient Details  Name: George [name] MRN: [id number] Date of Birth: [birth year] Referring Provider: Ty Hilts, MD  Encounter Date: 01/05/2016      OT End of Session - 01/05/16 1449    Visit Number 4   Number of Visits 12   Date for OT Re-Evaluation 02/05/16   Authorization Type BCBS - 25 visit limit. 8 used.    Authorization - Visit Number 12   Authorization - Number of Visits 25   OT Start Time 1300   OT Stop Time 1344   OT Time Calculation (min) 44 min   Activity Tolerance Patient tolerated treatment well   Behavior During Therapy WFL for tasks assessed/performed      Past Medical History  Diagnosis Date  . Depressed   . ADHD (attention deficit hyperactivity disorder)   . Dislocation of left shoulder joint   . Fracture of humeral head, left, closed     Past Surgical History  Procedure Laterality Date  . Orif humerus fracture Left 06/03/2015    Procedure: OPEN REDUCTION INTERNAL FIXATION LEFT PROXIMAL HUMERUS FRACTURE;  Surgeon: Vickki Hearing, MD;  Location: [facility];  Service: Orthopedics;  Laterality: Left;    There were no vitals filed for this visit.  Visit Diagnosis:  Tight fascia  Shoulder weakness  Shoulder stiffness, left  Pain in joint, shoulder region, left      Subjective Assessment - 01/05/16 1303    Subjective  S:  It feels bruised and tight.   Currently in Pain? Yes   Pain Score 6    Pain Location Shoulder   Pain Orientation Left   Pain Descriptors / Indicators Sore   Pain Type Acute pain            [facility] OT Assessment - 01/05/16 0001    Assessment   Diagnosis neck and left shoulder pain   Precautions   Precautions None   Restrictions   Weight Bearing Restrictions No                  OT Treatments/Exercises (OP) - 01/05/16 0001    Exercises   Exercises Shoulder;Neck   Shoulder Exercises: Supine   Protraction PROM;10 reps;Strengthening;15 reps   Protraction Weight (lbs) 1   Horizontal ABduction PROM;10 reps;Strengthening;15 reps   Horizontal ABduction Weight (lbs) 1   External Rotation PROM;10 reps;Strengthening;15 reps   External Rotation Weight (lbs) 1   Internal Rotation PROM;10 reps;Strengthening;15 reps   Internal Rotation Weight (lbs) 1   Flexion PROM;10 reps;Strengthening;15 reps   Shoulder Flexion Weight (lbs) 1   ABduction PROM;10 reps;Strengthening;15 reps   Shoulder ABduction Weight (lbs) 1   Other Supine Exercises serratus anterior punch 15 times with 1 #   Shoulder Exercises: Sidelying   External Rotation Strengthening;15 reps   External Rotation Weight (lbs) 1   Internal Rotation Strengthening;15 reps   Internal Rotation Weight (lbs) 1   Flexion Strengthening;15 reps   Flexion Weight (lbs) 1   ABduction Strengthening;15 reps   ABduction Weight (lbs) 1   Shoulder Exercises: Standing   External Rotation Theraband;15 reps   Theraband Level (Shoulder External Rotation) Level 2 (Red)   Internal Rotation Theraband;15 reps   Theraband Level (Shoulder Internal Rotation) Level 2 (Red)   Extension Theraband;10 reps   Theraband Level (Shoulder Extension) Level 2 (Red)   Row Theraband;10 reps  Theraband Level (Shoulder Row) Level 2 (Red)   Retraction Theraband;10 reps   Theraband Level (Shoulder Retraction) Level 2 (Red)   Shoulder Exercises: ROM/Strengthening   Proximal Shoulder Strengthening, Supine 10 times each with 1# with 2 rest breaks   Shoulder Exercises: Stretch   External Rotation Stretch 1 rep;10 seconds  table stretch   Manual Therapy   Manual Therapy Myofascial release   Manual therapy comments manual therapy completed prior measurements and exercises.   Myofascial Release Myofascial release and manual stretching to left upper arm, trapezius, and scapularis region to decrease fascial  restrictions and increase joint mobility in a pain free zone.                 OT Education - 01/05/16 1448    Education provided Yes   Education Details external rotation stretch at table, scapular stability theraband exercises:  extension ,retraction, row, internal rotation and external rotation   Person(s) Educated Patient   Methods Explanation;Demonstration;Handout   Comprehension Verbalized understanding;Returned demonstration          OT Short Term Goals - 01/04/16 1329    OT SHORT TERM GOAL #1   Title Paitent will be educated and independent with HEP to increase functional mobility in LUE during daily tasks and be able to return to work.    Time 3   Period Weeks   Status On-going   OT SHORT TERM GOAL #2   Title Patient will increase A/ROM to [facility] to increase ability to complete tasks above shoulder height.    Time 3   Period Weeks   Status Achieved   OT SHORT TERM GOAL #3   Title Patient will increase strength in LUE to 4-/5 to increase ability to lift lightweight items with arms close to body.   Time 3   Period Weeks   Status On-going   OT SHORT TERM GOAL #4   Title Patient will decrease pain level during daily tasks to 5/10 to increase ability to complete daily tasks using LUE.    Time 3   Period Weeks   Status On-going   OT SHORT TERM GOAL #5   Title Patient will decrease fascial restrictions to Mod amount in LUE to increase functional mobility   Time 3   Period Weeks   Status Achieved           OT Long Term Goals - 01/04/16 1332    OT LONG TERM GOAL #1   Title patient will return to highest level of independence with all daily and leisure activties and be able to return to somet ype of job.   Time 6   Period Weeks   Status On-going   OT LONG TERM GOAL #2   Title Patient will increase LUE strength to 4+/5 to increase ability to complete all daily activities with LUE as non dominant.    Time 6   Period Weeks   Status On-going   OT LONG TERM  GOAL #3   Title Patient will increase cervical extension by 5-10 degrees with report of less pain.   Time 6   Period Weeks   Status On-going   OT LONG TERM GOAL #4   Title Patient will decrease fascial restrictions to min amount.    Time 6   Period Weeks   Status On-going   OT LONG TERM GOAL #5   Title Patient will decrease pain level during daily tasks to 3/10 or less.    Time 6   Period  Weeks   Status On-going               Plan - 01/05/16 1451    Clinical Impression Statement A:  Patient had popping in shoulder joint with P/ROM into external rotation this date, after manual therapy he had slight increase in A/ROM.     Plan P:  Continue to improve A/ROM and strength of left shoulder to WNL for increased ability to return to work. follow up on HEP and add prone A/ROM exercises.  increase resistance with supine and sidelying exercises .    Consulted and Agree with Plan of Care Patient        Problem List Patient Active Problem List   Diagnosis Date Noted  . Fracture of greater tuberosity of left humerus   . CLOSED FRACTURE OF HEAD OF RADIUS 02/24/2008    Shirlean MylarBethany H. Murray, OTR/L [phone]  01/05/2016, 2:54 PM  [facility] [address] [place], [place], [address] Phone: [phone]   Fax:  [phone]  Name: Hans EdenRyan A Burruel MRN: [id number] Date of Birth: May 08, 1991

## 2016-01-13 ENCOUNTER — Ambulatory Visit (HOSPITAL_COMMUNITY): Payer: BLUE CROSS/BLUE SHIELD

## 2016-01-30 ENCOUNTER — Ambulatory Visit: Payer: BLUE CROSS/BLUE SHIELD | Admitting: Orthopedic Surgery

## 2016-02-01 ENCOUNTER — Ambulatory Visit (HOSPITAL_COMMUNITY): Payer: BLUE CROSS/BLUE SHIELD | Attending: Orthopedic Surgery

## 2016-02-01 DIAGNOSIS — M25512 Pain in left shoulder: Secondary | ICD-10-CM | POA: Insufficient documentation

## 2016-02-01 DIAGNOSIS — R29898 Other symptoms and signs involving the musculoskeletal system: Secondary | ICD-10-CM | POA: Insufficient documentation

## 2016-02-01 DIAGNOSIS — M25612 Stiffness of left shoulder, not elsewhere classified: Secondary | ICD-10-CM | POA: Insufficient documentation

## 2016-02-01 NOTE — Patient Instructions (Addendum)
Hold each stretch for 10 seconds and repeat 3 times. Complete stretches 2-3 times per day.  Doorway Stretch  Place each hand opposite each other on the doorway. (You can change where you feel the stretch by moving arms higher or lower.) Step through with one foot and bend front knee until a stretch is felt and hold. Step through with the opposite foot on the next rep.     WALL EXTERNAL ROTATION STRETCH   Place your affected hand on the wall with the elbow bent and gently turn your body the opposite direction until a stretch is felt.  ROM: Towel Stretch - with Interior Rotation    Pull left arm up behind back by pulling towel up with other arm.  http://orth.exer.us/888   Copyright  VHI. All rights reserved.    Wall Flexion  Using a towel, slide your arm up the wall until a stretch is felt in your shoulder .     Shoulder Abduction Stretch  Stand side ways by a wall with affected up on wall. Gently lean toward wall to feel stretch.  ELASTIC BAND INTERNAL ROTATION WITH ABDUCTION  Start by holding an elastic band or sports cord with your arm up at 90 degrees away from your side and elbow bent to 90 degrees. Your forearm should be directed upward in the beginning position as shown. Next, roll your shoulder forward so that your forearm become horizontal to the floor. Return to original position and repeat.    ELASTIC BAND - HORIZONTAL ABDUCTION   Start by holding an elastic band or tubing with your arm out-stretched in front of you and across your body towards the opposite side.  Next, pull the elastic band or cord horizontally and outward as shown.   Your elbow should be straight or slightly bent the entire time.     ELASTIC BAND SHOULDER FLEXION  While holding an elastic band at your side, draw up your arm up in front of you keeping your elbow straight.    AAROM ELASTIC BAND FLEXION   ELASTIC BAND ABDUCTION - SELF FIXED  Fixate the end of an elastic band in  front of you and on your leg with your unaffected arm.    Next, pull the band upward and to the side with your affected arm while keeping your elbow straight the entire time.  Pull down on an elastic band and then allow the band to assist your shoulder to raise back up.

## 2016-02-01 NOTE — Therapy (Addendum)
[place], [place], [address] Phone: [phone]   Fax:  [phone]  Occupational Therapy Treatment  Patient Details  Name: George [name] MRN: [id number] Date of Birth: [birth year] Referring Provider: Adonis Huguenin, MD  Encounter Date: 02/01/2016      OT End of Session - 02/01/16 1701    Visit Number 5   Number of Visits 12   Date for OT Re-Evaluation 03/02/16   Authorization Type [employer] - 25 visit limit. 8 used.    Authorization - Visit Number 13   Authorization - Number of Visits 25   OT Start Time 1350   OT Stop Time 1435   OT Time Calculation (min) 45 min   Activity Tolerance Patient tolerated treatment well   Behavior During Therapy WFL for tasks assessed/performed      Past Medical History  Diagnosis Date  . Depressed   . ADHD (attention deficit hyperactivity disorder)   . Dislocation of left shoulder joint   . Fracture of humeral head, left, closed     Past Surgical History  Procedure Laterality Date  . Orif humerus fracture Left 06/03/2015    Procedure: OPEN REDUCTION INTERNAL FIXATION LEFT PROXIMAL HUMERUS FRACTURE;  Surgeon: Carole Civil, MD;  Location: [facility];  Service: Orthopedics;  Laterality: Left;    There were no vitals filed for this visit.          [facility] OT Assessment - 02/01/16 1352    Assessment   Diagnosis neck and left shoulder pain   Precautions   Precautions None   ROM / Strength   AROM / PROM / Strength AROM;PROM;Strength   AROM   Overall AROM Comments Assessed standing. IR/er adducted.   AROM Assessment Site Shoulder   Left Shoulder Flexion 170 Degrees  previous 164   Left Shoulder ABduction 160 Degrees  previous 139   Left Shoulder Internal Rotation 90 Degrees  previous 90   Left Shoulder External Rotation 35 Degrees  previous 43   PROM   Overall PROM Comments Assessed supine. IR/er adducted.   PROM Assessment Site Shoulder   Right/Left  Shoulder Left   Left Shoulder Flexion 180 Degrees  previous 165   Left Shoulder ABduction 180 Degrees  previous 171   Left Shoulder Internal Rotation 90 Degrees  previous 90   Left Shoulder External Rotation 46 Degrees  previous 40   Strength   Overall Strength Comments Assessed standing. IR/er adducted   Strength Assessment Site Shoulder   Right/Left Shoulder Left   Left Shoulder Flexion 4-/5  previous 3+/5   Left Shoulder ABduction 4-/5  previous 3+/5   Left Shoulder Internal Rotation 4-/5  previous 4-/5   Left Shoulder External Rotation 4-/5  previous 4-/5                  OT Treatments/Exercises (OP) - 02/01/16 1703    Exercises   Exercises Shoulder   Shoulder Exercises: Supine   Protraction PROM;10 reps   Horizontal ABduction PROM;10 reps   External Rotation PROM;10 reps   Internal Rotation PROM;10 reps   Flexion PROM;10 reps   ABduction PROM;10 reps   Shoulder Exercises: Standing   Horizontal ABduction Theraband;10 reps   Theraband Level (Shoulder Horizontal ABduction) Level 2 (Red)   Flexion Theraband;10 reps   Theraband Level (Shoulder Flexion) Level 2 (Red)   ABduction Theraband;10 reps   Theraband Level (Shoulder ABduction) Level 2 (Red)   Extension Theraband;10 reps   Theraband Level (  Shoulder Extension) Level 2 (Red)   Shoulder Exercises: Stretch   Corner Stretch 1 rep;10 seconds   Internal Rotation Stretch 3 reps  10 seconds   Wall Stretch - Flexion 3 reps;10 seconds   Wall Stretch - ABduction 3 reps;10 seconds   Star Gazer Stretch 3 reps;10 seconds   Manual Therapy   Manual Therapy Myofascial release   Manual therapy comments manual therapy completed prior measurements and exercises.   Myofascial Release Myofascial release and manual stretching to left upper arm, trapezius, and scapularis region to decrease fascial restrictions and increase joint mobility in a pain free zone.                 OT Education - 02/01/16 1427     Education provided Yes   Education Details Provided patient with HEP of shoulder stretches and Theraband exercises.   Person(s) Educated Patient   Methods Explanation;Demonstration;Tactile cues;Verbal cues;Handout   Comprehension Tactile cues required;Verbal cues required;Returned demonstration;Verbalized understanding          OT Short Term Goals - 02/01/16 1452    OT SHORT TERM GOAL #1   Title Paitent will be educated and independent with HEP to increase functional mobility in LUE during daily tasks and be able to return to work.    Time 3   Period Weeks   Status Achieved   OT SHORT TERM GOAL #2   Title Patient will increase A/ROM to [facility] to increase ability to complete tasks above shoulder height.    Time 3   Period Weeks   OT SHORT TERM GOAL #3   Title Patient will increase strength in LUE to 4-/5 to increase ability to lift lightweight items with arms close to body.   Time 3   Period Weeks   Status Achieved   OT SHORT TERM GOAL #4   Title Patient will decrease pain level during daily tasks to 5/10 to increase ability to complete daily tasks using LUE.    Time 3   Period Weeks   Status Achieved   OT SHORT TERM GOAL #5   Title Patient will decrease fascial restrictions to Mod amount in LUE to increase functional mobility   Time 3   Period Weeks           OT Long Term Goals - 02/01/16 1453    OT LONG TERM GOAL #1   Title patient will return to highest level of independence with all daily and leisure activties and be able to return to [place] of job.   Time 6   Period Weeks   Status On-going   OT LONG TERM GOAL #2   Title Patient will increase LUE strength to 4+/5 to increase ability to complete all daily activities with LUE as non dominant.    Time 6   Period Weeks   Status On-going   OT LONG TERM GOAL #3   Title Patient will increase cervical extension by 5-10 degrees with report of less pain.   Time 6   Period Weeks   Status Achieved   OT LONG TERM GOAL #4    Title Patient will decrease fascial restrictions to min amount.    Time 6   Period Weeks   Status On-going   OT LONG TERM GOAL #5   Title Patient will decrease pain level during daily tasks to 3/10 or less.    Time 6   Period Weeks   Status Achieved  Plan - 02/01/16 1708    Clinical Impression Statement A: reassessment taken today. Patient has met a total of 4/5 short term goals and 2/5 long term goals. Patient did not provide any feedback on what areas have improved since therapy. Patient's measurements for ROM and strength have overall improved. HEP was updated to include shoulder stretches and theraband strengthening. Therapist believes that patient will do wel completing HEP independently at home. Patient states he would like to see how his MD appointment on Monday goes before discharging from therapy.    Plan P: Therapist recommends discharge with HEP although patient is hesistant at this time and would like to see what Dr. Aline Brochure says at follow up appointment on Monday.       Patient will benefit from skilled therapeutic intervention in order to improve the following deficits and impairments:  Decreased strength, Pain, Impaired UE functional use, Increased edema, Decreased range of motion, Increased fascial restricitons  Visit Diagnosis: Pain in left shoulder - Plan: Ot plan of care cert/re-cert  Stiffness of left shoulder, not elsewhere classified - Plan: Ot plan of care cert/re-cert  Other symptoms and signs involving the musculoskeletal system - Plan: Ot plan of care cert/re-cert    Problem List Patient Active Problem List   Diagnosis Date Noted  . Fracture of greater tuberosity of left humerus   . CLOSED FRACTURE OF HEAD OF RADIUS 02/24/2008    Ailene Ravel, OTR/L,CBIS  [phone]  02/01/2016, 5:17 PM  [facility] [address] [place], [place], [address] Phone: [phone]   Fax:   [phone]  Name: George [name] MRN: [id number] Date of Birth: 08/20/1991  OCCUPATIONAL THERAPY DISCHARGE SUMMARY  Visits from Start of Care: 5  Current functional level related to goals / functional outcomes: See above   Remaining deficits: See above   Education / Equipment: See above Plan: Patient agrees to discharge.  Patient goals were partially met. Patient is being discharged due to not returning since the last visit.  ?????

## 2016-02-06 ENCOUNTER — Encounter: Payer: Self-pay | Admitting: Orthopedic Surgery

## 2016-02-06 ENCOUNTER — Ambulatory Visit (INDEPENDENT_AMBULATORY_CARE_PROVIDER_SITE_OTHER): Payer: BLUE CROSS/BLUE SHIELD | Admitting: Orthopedic Surgery

## 2016-02-06 VITALS — BP 116/74 | Ht 73.0 in | Wt 215.0 lb

## 2016-02-06 DIAGNOSIS — M5020 Other cervical disc displacement, unspecified cervical region: Secondary | ICD-10-CM | POA: Diagnosis not present

## 2016-02-06 MED ORDER — DIAZEPAM 5 MG PO TABS: 5.0000 mg | ORAL_TABLET | Freq: Four times a day (QID) | ORAL | Status: DC | PRN

## 2016-02-06 MED ORDER — HYDROCODONE-ACETAMINOPHEN 5-325 MG PO TABS: 1.0000 | ORAL_TABLET | Freq: Three times a day (TID) | ORAL | Status: DC | PRN

## 2016-02-06 NOTE — Progress Notes (Signed)
Patient ID: George [name], male   DOB: 07/05/1991, 25 y.o.   MRN: [id number]  Chief Complaint  Patient presents with  . Follow-up    follow up neck    HPI-previous note history: The patient dislocated his right shoulder most recently. He had a open treatment internal fixation of a greater tuberosity fracture on the left basically comes in now complaining of neck and back pain. We treated him in the past with Robaxin and he was on hydrocodone and ibuprofen  He is now on Tylenol and Advil and is having significant pain running down his left arm  After physical therapy, hydrocodone, ibuprofen, Robaxin and Valium he continues with left sided arm pain radiating including cervical spine pain  Review of Systems  Musculoskeletal: Positive for myalgias and neck pain.  Neurological: Positive for tingling.    BP 116/74 mmHg  Ht 6\' 1"  (1.854 m)  Wt 215 lb (97.523 kg)  BMI 28.37 kg/m2  Physical Exam  Constitutional: He is oriented to person, place, and time. He appears well-developed and well-nourished. No distress.  Neck: Spinous process tenderness and muscular tenderness present. Rigidity present. Decreased range of motion present. No tracheal deviation present.  Lymphadenopathy:    He has no cervical adenopathy.  Neurological: He is alert and oriented to person, place, and time. He has normal reflexes.  Skin: Skin is warm. He is not diaphoretic. No erythema. No pallor.  Psychiatric: He has a normal mood and affect. His behavior is normal. Judgment and thought content normal.   Spurling sign positive left  Loss of sensation C5-6   Left Shoulder Exam   Tenderness  Left shoulder tenderness location: Left shoulder tenderness over the left trapezius muscle flexion 150 no significant weakness. Decreased external rotation.        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 Evaluate  C5 and C6 nerve roots for impingement  Meds ordered this encounter  Medications  . diazepam (VALIUM) 5 MG tablet    Sig: Take 1 tablet (5 mg total) by mouth every 6 (six) hours as needed for anxiety.    Dispense:  60 tablet    Refill:  0  . HYDROcodone-acetaminophen (NORCO) 5-325 MG tablet    Sig: Take 1 tablet by mouth every 8 (eight) hours as needed for moderate pain.    Dispense:  60 tablet    Refill:  0

## 2016-02-13 ENCOUNTER — Ambulatory Visit (HOSPITAL_COMMUNITY): Admission: RE | Admit: 2016-02-13 | Payer: BLUE CROSS/BLUE SHIELD | Source: Ambulatory Visit

## 2016-02-14 ENCOUNTER — Ambulatory Visit (HOSPITAL_COMMUNITY)
Admission: RE | Admit: 2016-02-14 | Discharge: 2016-02-14 | Disposition: A | Discharge status: Home / Self Care | Payer: BLUE CROSS/BLUE SHIELD | Source: Ambulatory Visit | Attending: Orthopedic Surgery | Admitting: Orthopedic Surgery

## 2016-02-14 DIAGNOSIS — M542 Cervicalgia: Secondary | ICD-10-CM | POA: Diagnosis present

## 2016-02-14 DIAGNOSIS — M5020 Other cervical disc displacement, unspecified cervical region: Secondary | ICD-10-CM | POA: Insufficient documentation

## 2016-02-20 ENCOUNTER — Ambulatory Visit: Payer: BLUE CROSS/BLUE SHIELD | Admitting: Orthopedic Surgery

## 2016-02-24 ENCOUNTER — Ambulatory Visit (INDEPENDENT_AMBULATORY_CARE_PROVIDER_SITE_OTHER): Payer: BLUE CROSS/BLUE SHIELD | Admitting: Orthopedic Surgery

## 2016-02-24 VITALS — BP 129/83 | HR 84 | Ht 61.0 in | Wt 215.0 lb

## 2016-02-24 DIAGNOSIS — S43005S Unspecified dislocation of left shoulder joint, sequela: Secondary | ICD-10-CM

## 2016-02-24 DIAGNOSIS — M62838 Other muscle spasm: Secondary | ICD-10-CM

## 2016-02-24 DIAGNOSIS — M5020 Other cervical disc displacement, unspecified cervical region: Secondary | ICD-10-CM

## 2016-02-24 DIAGNOSIS — M25512 Pain in left shoulder: Secondary | ICD-10-CM | POA: Diagnosis not present

## 2016-02-24 DIAGNOSIS — M6249 Contracture of muscle, multiple sites: Secondary | ICD-10-CM

## 2016-02-24 MED ORDER — HYDROCODONE-ACETAMINOPHEN 5-325 MG PO TABS: 1.0000 | ORAL_TABLET | Freq: Three times a day (TID) | ORAL | Status: DC | PRN

## 2016-02-24 MED ORDER — DIAZEPAM 5 MG PO TABS: 5.0000 mg | ORAL_TABLET | Freq: Four times a day (QID) | ORAL | Status: DC | PRN

## 2016-02-24 NOTE — Progress Notes (Signed)
Patient ID: George [name], male   DOB: [birth year], 25 y.o.   MRN: [id number]  Chief Complaint  Patient presents with  . Follow-up    MRI results    HPI history of bilateral shoulder dislocations left fracture shoulder dislocation status post open treatment internal fixation left greater tuberosity comminuted fracture  Cervical spine pain after surgery.  Review of Systems  Constitutional: Negative for fever, chills and weight loss.  Musculoskeletal: Positive for back pain, joint pain and neck pain.  Neurological: Negative for tingling and focal weakness.    BP 129/83 mmHg  Pulse 84  Ht 5\' 1"  (1.549 m)  Wt 215 lb (97.523 kg)  BMI 40.64 kg/m2  Physical Exam Physical Exam  Constitutional: The patient is oriented to person, place, and time. The patient appears well-developed and well-nourished. No distress.  Cardiovascular: Intact distal pulses.   Neurological: The patient is alert and oriented to person, place, and time. The patient exhibits normal muscle tone. Coordination normal.  Skin: Skin is warm and dry. No rash noted. The patient is not diaphoretic. No erythema. No pallor.  Psychiatric: The patient has a normal mood and affect. Her behavior is normal. Judgment and thought content normal.    Ortho Exam   Persistent stiffness and tenderness around the left shoulder decreased range of motion and weakness. Left superior shoulder area and trapezius muscle and parascapular muscle spasms tenderness in the cervical spine   ASSESSMENT AND PLAN   No cervical compressive pathology  Recommend continue current medications home exercises follow-up 3 months

## 2016-03-20 ENCOUNTER — Other Ambulatory Visit: Payer: Self-pay | Admitting: Neurology

## 2016-03-20 DIAGNOSIS — R569 Unspecified convulsions: Secondary | ICD-10-CM

## 2016-04-03 ENCOUNTER — Ambulatory Visit (INDEPENDENT_AMBULATORY_CARE_PROVIDER_SITE_OTHER): Payer: BLUE CROSS/BLUE SHIELD | Admitting: Orthopedic Surgery

## 2016-04-03 VITALS — BP 101/75 | HR 75 | Ht 72.0 in | Wt 225.0 lb

## 2016-04-03 DIAGNOSIS — M25512 Pain in left shoulder: Secondary | ICD-10-CM

## 2016-04-03 MED ORDER — HYDROCODONE-ACETAMINOPHEN 5-325 MG PO TABS: 1.0000 | ORAL_TABLET | Freq: Three times a day (TID) | ORAL | Status: DC | PRN

## 2016-04-03 MED ORDER — DIAZEPAM 5 MG PO TABS: 5.0000 mg | ORAL_TABLET | Freq: Four times a day (QID) | ORAL | Status: DC | PRN

## 2016-04-03 NOTE — Progress Notes (Signed)
Patient ID: George [name], male   DOB: 18-Dec-1990, 25 y.o.   MRN: [id number]  Chief Complaint  Patient presents with  . Follow-up    Left shoulder DOS 06/03/2015    HPI Mr. Laney PotashVanessa Meyer had a fracture of his left shoulder with dislocation had surgery in August 2016. He basically has had ongoing shoulder pain now has pain in his left periscapular region radiating to his cervical spine. He has done well with Valium and hydrocodone and ibuprofen. We got an MRI in his neck and it showed no pathology.  He did not regain full range of motion of the shoulder.    Review of Systems  Constitutional: Negative for fever.  Neurological: Positive for tingling.  Psychiatric/Behavioral: The patient has insomnia.       BP 101/75 mmHg  Pulse 75  Ht 6' (1.829 m)  Wt 225 lb (102.059 kg)  BMI 30.51 kg/m2 Gen. appearance is normal grooming and hygiene Orientation to person place and time normal Mood normal Gait is normal  No peripheral edema or swelling is noted in the left or right arm Sensory exam shows normal sensation to palpation, pressure and soft touch Skin exam no lacerations ulcerations or erythema  Ortho Exam Primarily has tenderness in the periscapular region at the superior angle of the scapula the left side. Right side is nontender. Right side has full range of motion stability and strength in the right shoulder.  Left shoulder has decreased range of motion in flexion and forward elevation within the scapular plane. He does not really have weakness in the rotator cuff on the left side    A/P  Medical decision-making  Right now I think he has a trigger point was negative for cervical spine pathology  He is agreeable to injection  We refilled his medicines Valium and hydrocodone  I would like to see him in a month to see if the injection had any effect  TRIGGER POINT INJECTION  Patient consented verbally for injection of the  posterior/MEDIAL scapula. Timeout confirmed  the site of injection A steroid injection was performed at inferior border of the left scapula at the point of maximal tenderness using 1% plain Lidocaine and 40 mg of Depo-Medrol. This was well tolerated.  Fuller CanadaStanley Harrison, MD 04/03/2016 8:55 PM

## 2016-04-05 ENCOUNTER — Ambulatory Visit: Payer: Self-pay | Admitting: Orthopedic Surgery

## 2016-04-16 ENCOUNTER — Ambulatory Visit (HOSPITAL_COMMUNITY)
Admission: RE | Admit: 2016-04-16 | Discharge: 2016-04-16 | Disposition: A | Discharge status: Home / Self Care | Payer: BLUE CROSS/BLUE SHIELD | Source: Ambulatory Visit | Attending: Neurology | Admitting: Neurology

## 2016-04-16 DIAGNOSIS — R569 Unspecified convulsions: Secondary | ICD-10-CM

## 2016-04-16 MED ORDER — GADOBENATE DIMEGLUMINE 529 MG/ML IV SOLN
20.0000 mL | Freq: Once | INTRAVENOUS | Status: AC | PRN
  Administered 2016-04-16: 20 mL via INTRAVENOUS

## 2016-05-09 ENCOUNTER — Ambulatory Visit (INDEPENDENT_AMBULATORY_CARE_PROVIDER_SITE_OTHER): Payer: BLUE CROSS/BLUE SHIELD | Admitting: Orthopedic Surgery

## 2016-05-09 ENCOUNTER — Encounter: Payer: Self-pay | Admitting: Orthopedic Surgery

## 2016-05-09 VITALS — BP 124/77 | HR 96 | Ht 72.0 in | Wt 225.0 lb

## 2016-05-09 DIAGNOSIS — M6249 Contracture of muscle, multiple sites: Secondary | ICD-10-CM | POA: Diagnosis not present

## 2016-05-09 DIAGNOSIS — S42202D Unspecified fracture of upper end of left humerus, subsequent encounter for fracture with routine healing: Secondary | ICD-10-CM | POA: Diagnosis not present

## 2016-05-09 DIAGNOSIS — M25512 Pain in left shoulder: Secondary | ICD-10-CM | POA: Diagnosis not present

## 2016-05-09 DIAGNOSIS — M62838 Other muscle spasm: Secondary | ICD-10-CM

## 2016-05-09 MED ORDER — DIAZEPAM 5 MG PO TABS: 5.0000 mg | ORAL_TABLET | Freq: Four times a day (QID) | ORAL | 0 refills | Status: DC | PRN

## 2016-05-09 MED ORDER — HYDROCODONE-ACETAMINOPHEN 5-325 MG PO TABS: 1.0000 | ORAL_TABLET | Freq: Three times a day (TID) | ORAL | 0 refills | Status: DC | PRN

## 2016-05-09 NOTE — Patient Instructions (Signed)

## 2016-05-09 NOTE — Progress Notes (Signed)
This is a follow-up visit  Status post internal fixation left shoulder two-part proximal humerus fracture involving greater tuberosity  Patient has a long history of seizure history multiple dislocations left shoulder of small bony Bankart lesion on CT scan prior to surgery  Last visit he was treated for trigger point left shoulder with good result from injection  We discussed his use of benzodiazepines or opioids and antidepressants. He's had no issues. Uses medicine appropriately. He's had no history of shortness of breath depression anxiety on current medical regimen  He is getting good pain relief and is allowed to function fairly normally with the current medications of hydrocodone, Valium, ibuprofen.  Hydrocodone value taken on an as-needed basis. He takes ibuprofen at night and morning  Review of Systems  Constitutional: Negative for chills and fever.  Neurological: Negative for tingling and tremors.  Psychiatric/Behavioral: Negative for depression, hallucinations, substance abuse and suicidal ideas. The patient is not nervous/anxious.    BP 124/77   Pulse 96   Ht 6' (1.829 m)   Wt 225 lb (102.1 kg)   BMI 30.52 kg/m   Physical Exam  Constitutional: He is oriented to person, place, and time. He appears well-developed and well-nourished. No distress.  HENT:  Head: Normocephalic and atraumatic.  Right Ear: External ear normal.  Left Ear: External ear normal.  Neurological: He is alert and oriented to person, place, and time. He has normal reflexes. He displays normal reflexes. He exhibits normal muscle tone. Coordination normal.  Skin: Skin is warm and dry. Capillary refill takes less than 2 seconds. No rash noted. He is not diaphoretic. No pallor.  Psychiatric: He has a normal mood and affect. His behavior is normal. Judgment and thought content normal.    Left shoulder tenderness at the superior angle of scapula. He still has a slight deficit in range of motion external  rotation and internal rotation is abduction and flexion are within 90% of his normal right shoulder  History no apprehension and left shoulder  Plan is to repeat his injection  History of his medication refilled.  Follow up in 8 weeks  TRIGGER POINT INJECTION  Patient consented verbally for injection of the left posterior/MEDIAL scapula. Timeout confirmed the site of injection A steroid injection was performed at inferior border of the left scapula at the point of maximal tenderness using 1% plain Lidocaine and 40 mg of Depo-Medrol. This was well tolerated.

## 2016-05-23 ENCOUNTER — Ambulatory Visit (HOSPITAL_COMMUNITY): Payer: Self-pay | Admitting: Psychology

## 2016-07-03 ENCOUNTER — Ambulatory Visit: Payer: Self-pay | Admitting: Orthopedic Surgery

## 2016-07-04 ENCOUNTER — Ambulatory Visit: Payer: Self-pay | Admitting: Orthopedic Surgery

## 2016-07-06 ENCOUNTER — Ambulatory Visit: Payer: Self-pay | Admitting: Orthopedic Surgery

## 2016-07-09 ENCOUNTER — Encounter: Payer: Self-pay | Admitting: Orthopedic Surgery

## 2016-07-09 ENCOUNTER — Ambulatory Visit (INDEPENDENT_AMBULATORY_CARE_PROVIDER_SITE_OTHER): Payer: BLUE CROSS/BLUE SHIELD | Admitting: Orthopedic Surgery

## 2016-07-09 VITALS — BP 137/84 | HR 69 | Wt 209.0 lb

## 2016-07-09 DIAGNOSIS — M25512 Pain in left shoulder: Secondary | ICD-10-CM

## 2016-07-09 DIAGNOSIS — S42202D Unspecified fracture of upper end of left humerus, subsequent encounter for fracture with routine healing: Secondary | ICD-10-CM

## 2016-07-09 DIAGNOSIS — S43004D Unspecified dislocation of right shoulder joint, subsequent encounter: Secondary | ICD-10-CM | POA: Diagnosis not present

## 2016-07-09 MED ORDER — DIAZEPAM 5 MG PO TABS: 5.0000 mg | ORAL_TABLET | Freq: Four times a day (QID) | ORAL | 0 refills | Status: DC | PRN

## 2016-07-09 MED ORDER — HYDROCODONE-ACETAMINOPHEN 5-325 MG PO TABS: 1.0000 | ORAL_TABLET | Freq: Three times a day (TID) | ORAL | 0 refills | Status: DC | PRN

## 2016-07-09 NOTE — Patient Instructions (Addendum)
Shoulder stabilization left shoulder   Surgery for Anterior Shoulder Instability With Phase III Rehab The purpose of this operation is to restore stability to the shoulder (glenohumeral) joint. In general, more stable joints allow less movement; therefore this procedure may reduce your range of motion. Multiple techniques exist for completing this surgery. Serious cases of anterior shoulder instability that involve recurrent shoulder dislocations or symptoms that persist for greater than 6 months despite non-surgical (conservative) treatment, may result in an inability to perform activities of daily living. Occasionally surgery will be recommended for individuals after their first dislocation; however, these cases typically have a lower recovery rate. REASONS NOT TO HAVE SURGERY  Infection of the shoulder joint.  Inability to complete a post-operative rehabilitation program.  Other conditions (emotional or psychological) that may affect the outcome of the surgery.  Shoulder instability in any other direction besides forward (anteriorly).  Purposely dislocating the shoulder.  Arthritis of the joint (not an absolute reason to not have surgery). RISKS AND COMPLICATIONS   Risks inherent to surgery: infection, bleeding, nerve damage, or damage to surrounding tissues.  Recurrent symptoms that result in a chronic problem.  Shoulder pain.  Detachment of one of the shoulder muscles (subscapularis).  Loss of motion.  Inability to compete in athletics.  Moving or breaking of surgical anchors.  Arthritis. TECHNIQUE Multiple techniques exist for surgically repairing anterior shoulder instability: arthroscopic techniques and open-incision techniques. Both techniques have the same goal of restoring stability to your shoulder and preventing future dislocations or partial dislocation (subluxations). Both techniques involve reattaching the rim of cartilage around the shoulder (labrum) that lines  the glenoid fossa if it has been torn away and then tightening the loose shoulder ligaments or tendons. The most common open-incision technique involves a larger incision to access the shoulder joint. The surgeon will enter the shoulder joint from between the pectoralis and deltoid muscles. In order to reach the joint capsule, the subscapularis muscle is either cut or removed. The labrum is then reattached to the glenoid fossa. After the labrum has been repair, the surgeon tightens the joint capsule by folding it upon itself and then he or she will sew (suture) the fold together.  Arthroscopic techniques for repairing anterior shoulder instability require multiple smaller incisions compared to open-incision surgery. The surgeon uses a video camera and small tools placed in the joint to repair the torn labrum by suturing it to the glenoid fossa. The joint capsule is then tightened by folding it upon itself and having it sutured together or by using heat, which shrinks the capsule.  There are other techniques that are less common that do not focus on trying to replicate the original structure of the joint. POST-OPERATIVE COURSE   The post-operative management depends on the surgical procedure and the preferences of your surgeon and therapist.  Keep the wound clean and dry for the first 10 to 14 days after surgery.  Immobilize the shoulder joint with the sling provided to you for the period of time that is specified by your surgeon.  You will be given pain medications by your caregiver.  Passive (without using muscles) shoulder movements may be begun immediately after surgery.  It is important to follow through with you rehabilitation program in order to have the best possible recovery. RETURN TO SPORTS   The rehabilitation period will depend on the sport and position you play as well as the success of the operation.  The minimum recovery period is 3 months.  You must have  regained complete  shoulder motion and strength before returning to sports.  Full shoulder motion and strength are necessary before returning to sports. SEEK MEDICAL CARE IF:   Any medications produce adverse side effects.  Any complications from surgery occur:  Pain, numbness, or coldness in the extremity operated upon.  Discoloration of the nail beds (they become blue or gray) of the extremity operated upon.  Signs of infections (fever, pain, inflammation, redness, or persistent bleeding). EXERCISES RANGE OF MOTION (ROM) AND STRETCHING EXERCISES - Surgery for Anterior Shoulder Instability, Phase III Rehab After you have gained significant flexibility and strength through Phase II and your shoulder is showing consistent signs of healing, your physician will introduce you to Phase III of your rehabilitation. These exercises are more aggressive and are intended to restore your shoulder mobility to levels which will allow you to return to your previous activities. Your clinician may also ask you to continue with Phase II exercises which are still of benefit to you. While completing these exercises, remember:   Restoring tissue flexibility helps normal motion to return to the joints. This allows healthier, less painful movement and activity.  An effective stretch should be held for at least 30 seconds. A stretch should never be painful. You should only feel a gentle lengthening or release in the stretch.  During your recovery, avoid activity or exercises which involve actions that place your right / left hand or elbow above your head or behind your back or head. These positions stress the tissues which are trying to heal. STRETCH - External Rotation and Abduction  Stagger your stance through a doorframe. It does not matter which foot is forward.  Choose one of the following positions as instructed by your physician, physical therapist or athletic trainer: place your hands  and forearms above your head and on  the door frame.  and forearms at head-height and on the door frame.  at elbow-height and on the door frame.  Keeping your head and chest upright and your stomach muscles tight to prevent over-extending your low-back, slowly shift your weight onto your front foot until you feel a stretch across your chest and/or in the front of your shoulders.  Hold __________ seconds. Shift your weight to your back foot to release the stretch. Repeat __________ times. Complete this stretch __________ times per day.  ROM - Internal Rotation  Using underhand grips, grasp a stick behind your back with both hands.  While standing upright with good posture, slide the stick up your back until you feel a mild stretch in the front of your shoulder.  Hold right / left. Slowly return to your starting position.  Grasp a stick behind your back with both hands. Repeat __________ times. Complete this exercise __________ times per day.  STRENGTHENING EXERCISES - Shoulder Instability, Anterior, Surgery For Phase III After you have gained significant flexibility and strength through Phase II and your shoulder is showing consistent signs of healing, your physician will introduce you to Phase III of your rehabilitation. These exercises are more aggressive and are intended to prepare you to gradually return to your previous activities. Your clinician may also ask you to continue with Phase II exercises which are still of benefit to you. While completing these exercises, remember:   Muscles can gain both the endurance and the strength needed for everyday activities through controlled exercises.  Complete these exercises as instructed by your physician, physical therapist or athletic trainer. Progress the resistance and repetitions only as guided.  You may experience muscle soreness or fatigue, but the pain or discomfort you are trying to eliminate should never worsen during these exercises. If this pain does worsen, stop and  make certain you are following the directions exactly. If the pain is still present after adjustments, discontinue the exercise until you can discuss the trouble with your clinician.  During your recovery, avoid activity or exercises which involve actions that place your right / left hand or elbow above your head or behind your back or head. These positions stress the tissues which are trying to heal. STRENGTH - Scapular Protractors, Standing  Stand arms-length away from a wall. Place your hands on the wall, keeping your elbows straight.  Begin by dropping your shoulder blades down and toward your mid-back spine.  To strengthen your protractors, keep your shoulder blades down, but slide them forward on your rib cage. It will feel as if you are lifting the back of your rib cage away from the wall. This is a subtle motion and can be challenging to complete. Ask your clinician for further instruction if you are not sure you are doing the exercise correctly.  Hold for __________ seconds. Slowly return to the starting position, resting the muscles completely before completing the next repetition. Repeat __________ times. Complete this exercise __________ times per day. STRENGTH - Scapular Protractors, Quadruped  Get onto your hands and knees with your shoulders directly over your hands (or as close as you comfortably can be).  Keeping your elbows locked, lift the back of your rib cage up into your shoulder blades so your mid-back rounds-out. Keep your neck muscles relaxed.  Hold this position for __________ seconds. Slowly return to the starting position and allow your muscles to relax completely before completing the next repetition. Repeat __________ times. Complete this exercise __________ times per day.  STRENGTH - Scapular Retractors  Secure a rubber exercise band/tubing so that it is at the height of your shoulders when you are either standing or sitting on a firm arm-less chair.  With a  palm-down grip, grasp an end of the band/tubing in each hand. Straighten your elbows and lift your hands straight in front of you at shoulder height. Step back away from the secured end of band/tubing until it becomes tense.  Squeezing your shoulder blades together, draw your elbows back as you bend them. Keep your upper arm lifted away from your body throughout the exercise.  Hold __________ seconds. Slowly ease the tension on the band/tubing as you reverse the directions and return to the starting position. Repeat __________ times. Complete this exercise __________ times per day. STRENGTH - Horizontal Adductors  Secure a rubber exercise band/tubing so that it is at the height of your shoulders when you are either standing or sitting on a firm arm-less chair.  Turn away from the secured band/tube so it is directly behind you. Grasp an end of the band/tubing in each hand and have your palms face each other. Step forward until the end of band/tubing until it becomes tense.  Keeping your arms at your sides, lift your elbows so they are 90 degrees from your body. Your arms should be slightly bent.  Keeping your arms elevated 90 degrees, draw your palms together.  Hold __________. Slowly ease the tension on the band/tubing as you reverse the directions and return to the starting position. Repeat __________ times. Complete this exercise __________ times per day. STRENGTH - Horizontal Abductors Choose one of the two oppositions to complete this  exercise. Prone: lying on stomach:  Lie on your stomach on a firm surface so that your right / left arm overhangs the edge. Rest your forehead on your opposite forearm. With your palm facing the floor and your elbow straight, hold a __________ weight in your hand.  Squeeze your right / left shoulder blade to your mid-back spine and then slowly raise your arm to the height of the bed.  Hold for __________ seconds. Slowly reverse the directions and return to  the starting position, controlling the weight as you lower your arm. Repeat __________ times. Complete this exercise __________ times per day. Standing:  Secure a rubber exercise band/tubing so that it is at the height of your shoulders when you are either standing or sitting on a firm arm-less chair.  Grasp an end of the band/tubing in each hand and have your palms face each other. Straighten your elbows and lift your hands straight in front of you at shoulder height. Step back away from the secured end of band/tubing until it becomes tense.  Squeeze your shoulder blades together. Keeping your elbows locked and your hands at shoulder-height, bring your hands out to your side.  Hold __________ seconds. Slowly ease the tension on the band/tubing as you reverse the directions and return to the starting position. Repeat __________ times. Complete this exercise __________ times per day. STRENGTH - Scapular Depressors  Find a sturdy chair without wheels, such as a from a dining room table.  Keeping your feet on the floor, lift your bottom from the seat and lock your elbows.  Keeping your elbows straight, allow gravity to pull your body weight down. Your shoulders will rise toward your ears.  Raise your body against gravity by drawing your shoulder blades down your back, shortening the distance between your shoulders and ears. Although your feet should always maintain contact with the floor, your feet should progressively support less body weight as you get stronger.  Hold __________ seconds. In a controlled and slow manner, lower your body weight to begin the next repetition. Repeat __________ times. Complete this exercise __________ times per day.  STRENGTH - Scapular Retractors and External Rotators   Secure a rubber exercise band/tubing so that it is at the height of your shoulders when you are either standing or sitting on a firm arm-less chair.  With a palm-down grip, grasp an end of the  band/tubing in each hand. Bend your elbows 90 degrees and lift your elbows to shoulder height at your sides. Step back away from the secured end of band/tubing until it becomes tense.  Squeezing your shoulder blades together, rotate your shoulder so that your upper arm and elbow remain stationary, but your fists travel upward to head-height.  Hold __________ for seconds. Slowly ease the tension on the band/tubing as you reverse the directions and return to the starting position. Repeat __________ times. Complete this exercise __________ times per day.  STRENGTH - Scapular Retractors and Elevators  Secure a rubber exercise band/tubing so that it is at the height of your shoulders when you are either standing or sitting on a firm arm-less chair.  With a thumbs-up grip, grasp an end of the band/tubing in each hand. Step back away from the secured end of band/tubing until it becomes tense.  Squeezing your shoulder blades together, straighten your elbows and lift your hands straight over your head.  Hold for __________ seconds. Slowly ease the tension on the band/tubing as you reverse the directions and return to  the starting position. Repeat __________ times. Complete this exercise __________ times per day.    This information is not intended to replace advice given to you by your health care provider. Make sure you discuss any questions you have with your health care provider.   Document Released: 01/23/2006 Document Revised: 02/15/2015 Document Reviewed: 01/13/2009 Elsevier Interactive Patient Education [employer]

## 2016-07-09 NOTE — Progress Notes (Signed)
Patient ID: George [name], male   DOB: 1990-10-28, 25 y.o.   MRN: [id number]  Chief Complaint  Patient presents with  . Follow-up    left shoulder trigger point    HPI George [name] is a 25 y.o. male.   HPI  Internal fixation left shoulder. Patient developed left shoulder trigger point and requests reevaluation. He still having pain over the medial border of the scapula at the superior angle. He also tells me that he is dislocated his left shoulder 4 times since last visit. He did simply reaching out for a coffee cup. He does have a history of seizure disorder but did not have a seizure prior to these dislocations  Review of Systems Review of Systems  Normal neurologic function in both shoulders no complaints of numbness or tingling  Physical Exam BP 137/84   Pulse 69   Wt 209 lb (94.8 kg)   BMI 28.35 kg/m    His affect is flat his appearance is normal is oriented 3 his mood is pleasant he walks normally. His left shoulder is notable for limited flexion about 25 loss of motion limited external rotation about 15 loss of motion and tenderness over the medial border of the scapula at the superior angle  So we will reinject the trigger point  He will talk to his parents about her right shoulder stabilization procedure  Refill medications  Meds ordered this encounter  Medications  . HYDROcodone-acetaminophen (NORCO) 5-325 MG tablet    Sig: Take 1 tablet by mouth every 8 (eight) hours as needed for moderate pain.    Dispense:  90 tablet    Refill:  0  . diazepam (VALIUM) 5 MG tablet    Sig: Take 1 tablet (5 mg total) by mouth every 6 (six) hours as needed for anxiety.    Dispense:  120 tablet    Refill:  0    TRIGGER POINT INJECTION  Patient consented verbally for injection of the Left posterior/MEDIAL scapula. Timeout confirmed the site of injection A steroid injection was performed at inferior border of the left scapula at the point of maximal tenderness using  1% plain Lidocaine and 40 mg of Depo-Medrol. This was well tolerated.

## 2016-08-07 ENCOUNTER — Ambulatory Visit (INDEPENDENT_AMBULATORY_CARE_PROVIDER_SITE_OTHER): Payer: BLUE CROSS/BLUE SHIELD | Admitting: Orthopedic Surgery

## 2016-08-07 ENCOUNTER — Encounter: Payer: Self-pay | Admitting: Orthopedic Surgery

## 2016-08-07 DIAGNOSIS — M25311 Other instability, right shoulder: Secondary | ICD-10-CM

## 2016-08-07 DIAGNOSIS — M25512 Pain in left shoulder: Secondary | ICD-10-CM | POA: Diagnosis not present

## 2016-08-07 MED ORDER — DIAZEPAM 5 MG PO TABS: 5.0000 mg | ORAL_TABLET | Freq: Four times a day (QID) | ORAL | 0 refills | Status: DC | PRN

## 2016-08-07 MED ORDER — HYDROCODONE-ACETAMINOPHEN 5-325 MG PO TABS
1.0000 | ORAL_TABLET | Freq: Three times a day (TID) | ORAL | 0 refills | Status: DC | PRN
Start: 2016-08-07 — End: 2016-09-04

## 2016-08-07 NOTE — Progress Notes (Signed)
Patient ID: George Edenyan A [name], male   DOB: [birth year], 25 y.o.   MRN: [id number]  Chief Complaint  Patient presents with  . Follow-up    BILATERAL SHOULDERS    HPI George [name] is a 25 y.o. male.   HPI  Recheck left shoulder. 25 year old male status post fracture dislocation left shoulder status post open treatment internal fixation. He has history of multiple seizures but no seizures since his Keppra has been maintained and he has stopped drinking.  He does have history of 6 dislocation of right shoulder no dislocation in the last month. One month ago which were coffee cup shoulder came out he was able to put it back and he is interested and shoulder surgery on the right  Review of Systems Review of Systems  Constitutional: Negative for fever.  Musculoskeletal: Positive for myalgias.  Neurological: Negative for weakness and numbness.     Physical Exam There were no vitals taken for this visit.   Physical Exam Awake and alert Mood norma; appearance normal hygiene and grooming  Painful abduction external rotation with positive apprehension on the right shoulder. Mild sulcus sign. Radial ulnar and median nerve are normal and sensation and motor function. Has normal passive range of motion in the right shoulder.  Tenderness over the medial border of the left scapula consistent with trigger point  Encounter Diagnoses  Name Primary?  . Shoulder instability, right Yes  . Trigger point of shoulder region, left       Plan inject left shoulder trigger point  Occupational therapy for stabilization right shoulder for dislocation recurrent  Recommend one month follow up if no improvement strongly consider arthroscopic stabilization    Procedure note indirect shoulder trigger point  At the point of maximal tenderness at the superior angle of the scapula 25-gauge needle was then used to inject Depo-Medrol 40 mg and 3 mL 1% lidocaine using an oblique angle technique.  occasions. Verbal consent and timeout was appropriately taken

## 2016-08-15 ENCOUNTER — Ambulatory Visit (HOSPITAL_COMMUNITY): Payer: BLUE CROSS/BLUE SHIELD | Admitting: Specialist

## 2016-08-16 ENCOUNTER — Ambulatory Visit (HOSPITAL_COMMUNITY): Payer: BLUE CROSS/BLUE SHIELD | Attending: Orthopedic Surgery

## 2016-08-16 ENCOUNTER — Encounter (HOSPITAL_COMMUNITY): Payer: Self-pay

## 2016-08-16 ENCOUNTER — Telehealth (HOSPITAL_COMMUNITY): Payer: Self-pay

## 2016-08-16 DIAGNOSIS — M25611 Stiffness of right shoulder, not elsewhere classified: Secondary | ICD-10-CM | POA: Diagnosis present

## 2016-08-16 DIAGNOSIS — M25511 Pain in right shoulder: Secondary | ICD-10-CM | POA: Diagnosis not present

## 2016-08-16 DIAGNOSIS — R29898 Other symptoms and signs involving the musculoskeletal system: Secondary | ICD-10-CM

## 2016-08-16 NOTE — Patient Instructions (Signed)

## 2016-08-16 NOTE — Telephone Encounter (Signed)
Called mother back to offer Nov 7th at 3:15 waiting on her to call back to accept that date and time. NF 08/16/16

## 2016-08-16 NOTE — Therapy (Signed)
[facility] [address] [place], [place], [address] Phone: [phone]   Fax:  [phone]  Occupational Therapy Evaluation  Patient Details  Name: George [name] MRN: [id number] Date of Birth: [birth year] Referring Provider: Fuller Canada, MD  Encounter Date: 08/16/2016      OT End of Session - 08/16/16 1345    Visit Number 1   Number of Visits 8   Date for OT Re-Evaluation 09/15/16   Authorization Type [employer] - 25 visit limit (02/13/16-02/11/17) 0 used    Authorization - Visit Number 1   Authorization - Number of Visits 25   OT Start Time 1259   OT Stop Time 1340   OT Time Calculation (min) 41 min   Activity Tolerance Patient tolerated treatment well   Behavior During Therapy [facility] for tasks assessed/performed      Past Medical History:  Diagnosis Date  . ADHD (attention deficit hyperactivity disorder)   . Depressed   . Dislocation of left shoulder joint   . Fracture of humeral head, left, closed     Past Surgical History:  Procedure Laterality Date  . ORIF HUMERUS FRACTURE Left 06/03/2015   Procedure: OPEN REDUCTION INTERNAL FIXATION LEFT PROXIMAL HUMERUS FRACTURE;  Surgeon: Vickki Hearing, MD;  Location: [facility];  Service: Orthopedics;  Laterality: Left;    There were no vitals filed for this visit.      Subjective Assessment - 08/16/16 1303    Subjective  S: It hasn't dislocated in a month and a half.   Pertinent History Patient is a 25 y/o male S/P right shoulder instability and weakness which began approximately 2 months. George [name] states that he was reaching for his coffee (low external rotation) and also stretching in the morning (external rotation with shoulder abducted) and he experienced a dislocation. George [name] states he has experienced 4-5 shoulder dislocations in a month's time. No dislocations have occured in the past month and a half. Dr. Romeo Apple has referred patient to occupational therapy for evaulation and  treatment.    Special Tests FOTO to be completed at next session.   Patient Stated Goals To decrease shoulder dislocations and increase use.   Currently in Pain? Yes   Pain Score 4    Pain Location Shoulder   Pain Orientation Left   Pain Descriptors / Indicators Sore   Pain Type Acute pain   Pain Radiating Towards N/A   Pain Onset More than a month ago   Pain Frequency Intermittent   Aggravating Factors  reaching up   Pain Relieving Factors icy hot, rest   Effect of Pain on Daily Activities might slow down but still complete what he needs to complete.   Multiple Pain Sites No           [facility] OT Assessment - 08/16/16 1305      Assessment   Diagnosis Right shoulder instability   Referring Provider Fuller Canada, MD   Onset Date --  2 months ago   Assessment 09/04/16 - Follow up   Prior Therapy Pt received therapy on LUE previously.. Nothing for his RUE previously.      Precautions   Precautions Other (comment)   Precaution Comments Do not complete internal and external rotation with shoulder abduction     Restrictions   Weight Bearing Restrictions No     Balance Screen   Has the patient fallen in the past 6 months No     Home  Environment   Family/patient expects to  be discharged to: Private residence     Prior Function   Level of Independence Independent   Vocation Unemployed     ADL   ADL comments Difficulty reaching above shoulder. Decreased strength in RUE and shoulder region.     Mobility   Mobility Status Independent     Written Expression   Dominant Hand Right     Vision - History   Baseline Vision No visual deficits     Cognition   Overall Cognitive Status Within Functional Limits for tasks assessed     ROM / Strength   AROM / PROM / Strength PROM;AROM;Strength     Palpation   Palpation comment Max fascial restrictions in right upper arm, trapezius, and scapularis region.     AROM   Overall AROM Comments Assessed seated. IR/er adducted.    AROM Assessment Site Shoulder   Right/Left Shoulder Right   Right Shoulder Flexion 135 Degrees   Right Shoulder ABduction 160 Degrees   Right Shoulder Internal Rotation 90 Degrees   Right Shoulder External Rotation 76 Degrees     PROM   Overall PROM Comments Assessed supine. IR/er adducted.   PROM Assessment Site Shoulder   Right/Left Shoulder Right   Right Shoulder Flexion 160 Degrees   Right Shoulder ABduction 180 Degrees   Right Shoulder Internal Rotation 90 Degrees   Right Shoulder External Rotation 71 Degrees     Strength   Overall Strength Comments Assessed seated. IR/er adducted.   Strength Assessment Site Shoulder   Right/Left Shoulder Right   Right Shoulder Flexion 4-/5   Right Shoulder ABduction 4-/5   Right Shoulder Internal Rotation 4-/5   Right Shoulder External Rotation 3+/5                         OT Education - 08/16/16 1345    Education provided Yes   Education Details scapular strengthening exercises with red theraband   Person(s) Educated Patient   Methods Explanation;Demonstration;Verbal cues;Handout   Comprehension Returned demonstration;Verbalized understanding          OT Short Term Goals - 08/16/16 1404      OT SHORT TERM GOAL #1   Title Paitent will be educated and independent with HEP to increase functional mobility in RUE during daily tasks.   Time 4   Period Weeks   Status New     OT SHORT TERM GOAL #2   Title Patient will increase A/ROM to WNL to increase ability to complete tasks above shoulder height.    Time 4   Period Weeks   Status New     OT SHORT TERM GOAL #3   Title Patient will increase strength in RUE to 4+/5 to increase ability to lift lightweight items.   Time 4   Period Weeks   Status New     OT SHORT TERM GOAL #4   Title Patient will report a low pain level of less than 4/10 when completing functional reaching tasks with his RUE.    Time 4   Period Weeks   Status New     OT SHORT TERM GOAL  #5   Title Patient will decrease fascial restrictions to Min amount in RUE to increase functional mobility   Time 4   Period Weeks   Status New               Plan - 08/16/16 1347    Clinical Impression Statement A: Patient is a 25 y/o  male S/P right shoulder instability causing increased pain, fascial restrictions and decreased strength and ROM resulting in dificulty completing daily tasks.    Rehab Potential Good   OT Frequency 2x / week   OT Duration 4 weeks   OT Treatment/Interventions Self-care/ADL training;Therapeutic exercise;Dry needling;Patient/family education;Manual Therapy;Ultrasound;Cryotherapy;Electrical Stimulation;Moist Heat;Passive range of motion;DME and/or AE instruction;Therapeutic activities   Plan P: Patient will benefit from skilled OT services to increase functional performance during daily tasks using RUE. Treatment Plan: myofascial release, manual stretching, A/ROM, scapular and shoulder strengthening.    OT Home Exercise Plan 11/2: Scapular theraband exercises (red).   Consulted and Agree with Plan of Care Patient      Patient will benefit from skilled therapeutic intervention in order to improve the following deficits and impairments:  Decreased strength, Decreased range of motion, Pain, Impaired UE functional use, Increased fascial restricitons  Visit Diagnosis: Acute pain of right shoulder - Plan: Ot plan of care cert/re-cert  Stiffness of right shoulder, not elsewhere classified - Plan: Ot plan of care cert/re-cert  Other symptoms and signs involving the musculoskeletal system - Plan: Ot plan of care cert/re-cert    Problem List Patient Active Problem List   Diagnosis Date Noted  . Fracture of greater tuberosity of left humerus   . CLOSED FRACTURE OF HEAD OF RADIUS 02/24/2008   Limmie PatriciaLaura Essenmacher, OTR/L,CBIS  [phone]  08/16/2016, 2:13 PM  [facility] [address] [place], [place],  [address] Phone: [phone]   Fax:  [phone]  Name: George [name] MRN: [id number] Date of Birth: [birth year]

## 2016-08-21 ENCOUNTER — Encounter (HOSPITAL_COMMUNITY): Payer: Self-pay

## 2016-08-21 ENCOUNTER — Ambulatory Visit (HOSPITAL_COMMUNITY): Payer: BLUE CROSS/BLUE SHIELD

## 2016-08-21 DIAGNOSIS — M25611 Stiffness of right shoulder, not elsewhere classified: Secondary | ICD-10-CM

## 2016-08-21 DIAGNOSIS — M25511 Pain in right shoulder: Secondary | ICD-10-CM | POA: Diagnosis not present

## 2016-08-21 DIAGNOSIS — R29898 Other symptoms and signs involving the musculoskeletal system: Secondary | ICD-10-CM

## 2016-08-21 NOTE — Therapy (Signed)
[facility] [address] [place], [place], [address] Phone: [phone]   Fax:  [phone]  Occupational Therapy Treatment  Patient Details  Name: George [name] MRN: [id number] Date of Birth: [birth year] Referring Provider: Fuller CanadaStanley Harrison, MD  Encounter Date: 08/21/2016      OT End of Session - 08/21/16 1556    Visit Number 2   Number of Visits 8   Date for OT Re-Evaluation 09/15/16   Authorization Type [employer] - 25 visit limit (02/13/16-02/11/17) 0 used    Authorization - Visit Number 2   Authorization - Number of Visits 25   OT Start Time 1523  pt arrived late   OT Stop Time 1600   OT Time Calculation (min) 37 min   Activity Tolerance Patient tolerated treatment well   Behavior [facility] for tasks assessed/performed      Past Medical History:  Diagnosis Date  . ADHD (attention deficit hyperactivity disorder)   . Depressed   . Dislocation of left shoulder joint   . Fracture of humeral head, left, closed     Past Surgical History:  Procedure Laterality Date  . ORIF HUMERUS FRACTURE Left 06/03/2015   Procedure: OPEN REDUCTION INTERNAL FIXATION LEFT PROXIMAL HUMERUS FRACTURE;  Surgeon: Vickki HearingStanley E Harrison, MD;  Location: [facility];  Service: Orthopedics;  Laterality: Left;    There were no vitals filed for this visit.      Subjective Assessment - 08/21/16 1525    Subjective  S: I don't have any pain today.    Special Tests FOTO score: 50/100   Currently in Pain? No/denies            [facility] - 08/21/16 1526      Assessment   Diagnosis Right shoulder instability     Precautions   Precautions Other (comment)   Precaution Comments Do not complete internal and external rotation with shoulder abduction                  OT Treatments/Exercises (OP) - 08/21/16 1542      Exercises   Exercises Shoulder     Shoulder Exercises: Supine   Protraction PROM;5 reps;Strengthening;12 reps    Protraction Weight (lbs) 2   Horizontal ABduction PROM;5 reps;Strengthening;12 reps   Horizontal ABduction Weight (lbs) 2   External Rotation PROM;5 reps;Strengthening;12 reps   External Rotation Weight (lbs) 2   Internal Rotation PROM;5 reps;Strengthening;12 reps   Internal Rotation Weight (lbs) 2   Flexion PROM;5 reps;Strengthening;12 reps   Shoulder Flexion Weight (lbs) 2   ABduction PROM;5 reps;Strengthening;12 reps   Shoulder ABduction Weight (lbs) 2     Shoulder Exercises: Standing   Protraction Strengthening;10 reps   Protraction Weight (lbs) 2   Horizontal ABduction Strengthening;10 reps   Horizontal ABduction Weight (lbs) 2   External Rotation Strengthening;10 reps   External Rotation Weight (lbs) 2   Internal Rotation Strengthening;10 reps   Internal Rotation Weight (lbs) 2   Flexion Strengthening;10 reps   Shoulder Flexion Weight (lbs) 2   ABduction Strengthening;10 reps   Shoulder ABduction Weight (lbs) 2     Manual Therapy   Manual Therapy Myofascial release   Manual therapy comments manual therapy completed prior measurements and exercises.   Myofascial Release Myofascial release and manual stretching completed to right upper arm, trapezius, and scapularis region to decrease fascial restrictions and increase joint mobility in a pain free zone.  OT Short Term Goals - 08/21/16 1547      OT SHORT TERM GOAL #1   Title Paitent will be educated and independent with HEP to increase functional mobility in RUE during daily tasks.   Time 4   Period Weeks   Status On-going     OT SHORT TERM GOAL #2   Title Patient will increase A/ROM to WNL to increase ability to complete tasks above shoulder height.    Time 4   Period Weeks   Status On-going     OT SHORT TERM GOAL #3   Title Patient will increase strength in RUE to 4+/5 to increase ability to lift lightweight items.   Time 4   Period Weeks   Status On-going     OT SHORT TERM GOAL #4    Title Patient will report a low pain level of less than 4/10 when completing functional reaching tasks with his RUE.    Time 4   Period Weeks   Status On-going     OT SHORT TERM GOAL #5   Title Patient will decrease fascial restrictions to Min amount in RUE to increase functional mobility   Time 4   Period Weeks   Status On-going                  Plan - 08/21/16 1623    Clinical Impression Statement A: Initiated myofascial release, manual stretching, and strengthening exercises. Pt did overall well with VC for form and technique.    Plan P: Add prone and sidelying scapular and strengthening exercises.       Patient will benefit from skilled therapeutic intervention in order to improve the following deficits and impairments:  Decreased strength, Decreased range of motion, Pain, Impaired UE functional use, Increased fascial restricitons  Visit Diagnosis: Stiffness of right shoulder, not elsewhere classified  Other symptoms and signs involving the musculoskeletal system    Problem List Patient Active Problem List   Diagnosis Date Noted  . Fracture of greater tuberosity of left humerus   . CLOSED FRACTURE OF HEAD OF RADIUS 02/24/2008   Limmie PatriciaLaura Seith Aikey, OTR/L,CBIS  [phone]  08/21/2016, 4:26 PM  [facility] [address] [place], [place], [address] Phone: [phone]   Fax:  [phone]  Name: George [name] MRN: [id number] Date of Birth: 1991/05/19

## 2016-08-29 ENCOUNTER — Ambulatory Visit (HOSPITAL_COMMUNITY): Payer: BLUE CROSS/BLUE SHIELD | Admitting: Occupational Therapy

## 2016-08-29 ENCOUNTER — Telehealth (HOSPITAL_COMMUNITY): Payer: Self-pay | Admitting: Internal Medicine

## 2016-08-30 ENCOUNTER — Telehealth (HOSPITAL_COMMUNITY): Payer: Self-pay | Admitting: Internal Medicine

## 2016-08-30 ENCOUNTER — Ambulatory Visit (HOSPITAL_COMMUNITY): Payer: BLUE CROSS/BLUE SHIELD

## 2016-08-30 NOTE — Telephone Encounter (Signed)
08/30/16 mom left a message that he needed to cx because he was in too much pain and it looks like he has a raised area and will see Dr. Romeo AppleHarrison about it next week

## 2016-09-04 ENCOUNTER — Ambulatory Visit (INDEPENDENT_AMBULATORY_CARE_PROVIDER_SITE_OTHER): Payer: BLUE CROSS/BLUE SHIELD | Admitting: Orthopedic Surgery

## 2016-09-04 ENCOUNTER — Encounter: Payer: Self-pay | Admitting: Orthopedic Surgery

## 2016-09-04 DIAGNOSIS — S43004D Unspecified dislocation of right shoulder joint, subsequent encounter: Secondary | ICD-10-CM

## 2016-09-04 DIAGNOSIS — M62838 Other muscle spasm: Secondary | ICD-10-CM | POA: Diagnosis not present

## 2016-09-04 DIAGNOSIS — M25512 Pain in left shoulder: Secondary | ICD-10-CM

## 2016-09-04 DIAGNOSIS — M25311 Other instability, right shoulder: Secondary | ICD-10-CM | POA: Diagnosis not present

## 2016-09-04 DIAGNOSIS — M2242 Chondromalacia patellae, left knee: Secondary | ICD-10-CM

## 2016-09-04 DIAGNOSIS — M2241 Chondromalacia patellae, right knee: Secondary | ICD-10-CM

## 2016-09-04 MED ORDER — ETODOLAC 500 MG PO TABS: 500.0000 mg | ORAL_TABLET | Freq: Two times a day (BID) | ORAL | 3 refills | Status: AC

## 2016-09-04 MED ORDER — DIAZEPAM 5 MG PO TABS: 5.0000 mg | ORAL_TABLET | Freq: Four times a day (QID) | ORAL | 0 refills | Status: DC | PRN

## 2016-09-04 MED ORDER — HYDROCODONE-ACETAMINOPHEN 5-325 MG PO TABS: 1.0000 | ORAL_TABLET | Freq: Three times a day (TID) | ORAL | 0 refills | Status: DC | PRN

## 2016-09-04 NOTE — Progress Notes (Signed)
Patient ID: George [name], male   DOB: 04-09-1991, 25 y.o.   MRN: [id number]  Chief Complaint  Patient presents with  . Follow-up    bilateral shoulders    HPI George [name] is a 25 y.o. male.  Presents with new complaints of bilateral knee pain, back pain, old complaint of left shoulder trigger point  The patient was an avid Advice workerskateboarder and has bilateral anterior knee pain aching pain with a sitting for long periods of time pain when he has to go up and down the steps  Recurrent left periscapular pain  Lower back pain relief with an injection of intramuscular steroid from his primary care doctor. Pain noted since the age of 89.  He would also like his pain medication increased.  Review of Systems Review of Systems  Constitutional: Negative.   Respiratory: Negative.   Cardiovascular: Negative.   Gastrointestinal: Negative.   Neurological: Negative for dizziness and numbness.       History of multiple seizures currently under control     Past Medical History:  Diagnosis Date  . ADHD (attention deficit hyperactivity disorder)   . Depressed   . Dislocation of left shoulder joint   . Fracture of humeral head, left, closed     Past Surgical History:  Procedure Laterality Date  . ORIF HUMERUS FRACTURE Left 06/03/2015   Procedure: OPEN REDUCTION INTERNAL FIXATION LEFT PROXIMAL HUMERUS FRACTURE;  Surgeon: Vickki HearingStanley E Damico Partin, MD;  Location: [facility];  Service: Orthopedics;  Laterality: Left;    Social History Social History  Substance Use Topics  . Smoking status: Former Smoker    Packs/day: 1.00  . Smokeless tobacco: Current User  . Alcohol use 0.0 oz/week    No Known Allergies  Current Meds  Medication Sig  . acetaminophen (TYLENOL) 500 MG tablet Take 500 mg by mouth 2 (two) times daily as needed.  Marland Kitchen. amphetamine-dextroamphetamine (ADDERALL) 10 MG tablet Take 10 mg by mouth daily with breakfast.  . diazepam (VALIUM) 5 MG tablet Take 1 tablet (5 mg total) by  mouth every 6 (six) hours as needed for anxiety.  Marland Kitchen. escitalopram (LEXAPRO) 20 MG tablet Take 20 mg by mouth daily.  Marland Kitchen. HYDROcodone-acetaminophen (NORCO) 5-325 MG tablet Take 1 tablet by mouth every 8 (eight) hours as needed for moderate pain.  Marland Kitchen. levETIRAcetam (KEPPRA) 500 MG tablet Take 1 tablet (500 mg total) by mouth 2 (two) times daily.      Physical Exam Physical Exam There were no vitals taken for this visit.  Gen. appearance. The patient is well-developed and well-nourished, grooming and hygiene are normal. There are no gross congenital abnormalities  The patient is alert and oriented to person place and time  Mood and affect are normal  Ambulation normal   Examination reveals the following: On inspection we find mild tenderness in the lower back with a negative right straight leg raise  Bilateral knee exam. Full flexion extension in both right and left knee. Patellofemoral pain and crepitation on range of motion right left knee.  Both knees are stable including patellofemoral stress test right and left knee  Muscle tone is normal in the right left quadriceps.  No skin abnormalities are seen over the right or left knee. He has normal sensation in both legs and no peripheral edema in either ankle  Tenderness over the left superior angle of the scapula    Data Reviewed   Assessment    Encounter Diagnoses  Name Primary?  . Shoulder instability, right Yes  .  Trigger point of shoulder region, left   . Shoulder dislocation, right, subsequent encounter   . Cervical paraspinal muscle spasm   . Chondromalacia of both patellae        Plan    TRIGGER POINT INJECTION  Patient consented verbally for injection of the Left posterior/MEDIAL scapula. Timeout confirmed the site of injection A steroid injection was performed at inferior border of the left scapula at the point of maximal tenderness using 1% plain Lidocaine and 40 mg of Depo-Medrol. This was well tolerated.      I put him on anti-inflammatory medication he can continue with the current dose of hydrocodone and Valium  Follow-up one month  George [name] 09/04/2016, 3:05 PM

## 2016-09-04 NOTE — Patient Instructions (Signed)

## 2016-09-12 ENCOUNTER — Ambulatory Visit (HOSPITAL_COMMUNITY): Payer: BLUE CROSS/BLUE SHIELD | Admitting: Occupational Therapy

## 2016-09-12 ENCOUNTER — Telehealth (HOSPITAL_COMMUNITY): Payer: Self-pay | Admitting: Internal Medicine

## 2016-09-12 NOTE — Telephone Encounter (Signed)
Mom called to say George [name] have a stomach virus.

## 2016-09-21 ENCOUNTER — Ambulatory Visit (HOSPITAL_COMMUNITY): Payer: BLUE CROSS/BLUE SHIELD

## 2016-10-01 ENCOUNTER — Ambulatory Visit (HOSPITAL_COMMUNITY): Payer: BLUE CROSS/BLUE SHIELD | Attending: Orthopedic Surgery | Admitting: Specialist

## 2016-10-02 ENCOUNTER — Encounter: Payer: Self-pay | Admitting: Orthopedic Surgery

## 2016-10-02 ENCOUNTER — Ambulatory Visit (INDEPENDENT_AMBULATORY_CARE_PROVIDER_SITE_OTHER): Payer: BLUE CROSS/BLUE SHIELD | Admitting: Orthopedic Surgery

## 2016-10-02 DIAGNOSIS — M25512 Pain in left shoulder: Secondary | ICD-10-CM | POA: Diagnosis not present

## 2016-10-02 MED ORDER — HYDROCODONE-ACETAMINOPHEN 5-325 MG PO TABS: 1.0000 | ORAL_TABLET | Freq: Three times a day (TID) | ORAL | 0 refills | Status: DC | PRN

## 2016-10-02 MED ORDER — DIAZEPAM 5 MG PO TABS: 5.0000 mg | ORAL_TABLET | Freq: Four times a day (QID) | ORAL | 0 refills | Status: DC | PRN

## 2016-10-02 NOTE — Progress Notes (Signed)
Patient ID: George [name], male   DOB: [birth year], 25 y.o.   MRN: [id number]  Chief Complaint  Patient presents with  . Follow-up    KNEES AND SHOULDER    HPI George [name] is a 25 y.o. male.   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 He appears well-developed and well-nourished. No distress.  Cardiovascular: Normal rate and intact distal pulses.   Neurological: He is alert and oriented to person, place, and time.  Skin: Skin is warm and dry. No rash noted. He is not diaphoretic. No erythema. No pallor.  Psychiatric: He has a normal mood and affect. His behavior is normal. Judgment and thought content normal.   Left shoulder periscapular tenderness superior angle of the scapular border. Normal range of motion in both shoulders. Mild tenderness in the cervical spine at the C7-T1 interspace  Increased muscle tension levator scapula.  MEDICAL DECISION MAKING  DATA   none  DIAGNOSIS  Encounter Diagnosis  Name Primary?  . Trigger point of shoulder region, left      PLAN(RISK)   TRIGGER POINT INJECTION  Patient consented verbally for injection of the right posterior/MEDIAL scapula. Timeout confirmed the site of injection A steroid injection was performed at inferior border of the right scapula at the point of maximal tenderness using 1% plain Lidocaine and 40 mg of Depo-Medrol. This was well tolerated.  Return 1 month   Meds ordered this encounter  Medications  . HYDROcodone-acetaminophen (NORCO) 5-325 MG tablet    Sig: Take 1 tablet by mouth every 8 (eight) hours as needed for  moderate pain.    Dispense:  90 tablet    Refill:  0  . diazepam (VALIUM) 5 MG tablet    Sig: Take 1 tablet (5 mg total) by mouth every 6 (six) hours as needed for anxiety.    Dispense:  120 tablet    Refill:  0

## 2016-10-19 ENCOUNTER — Telehealth (HOSPITAL_COMMUNITY): Payer: Self-pay | Admitting: Occupational Therapy

## 2016-10-19 ENCOUNTER — Encounter (HOSPITAL_COMMUNITY): Payer: Self-pay | Admitting: Occupational Therapy

## 2016-10-19 NOTE — Telephone Encounter (Signed)
Patient canceled his appt he donot have transportation today.

## 2016-10-24 ENCOUNTER — Encounter (HOSPITAL_COMMUNITY): Payer: Self-pay | Admitting: Occupational Therapy

## 2016-10-24 ENCOUNTER — Ambulatory Visit (HOSPITAL_COMMUNITY): Payer: BLUE CROSS/BLUE SHIELD | Attending: Orthopedic Surgery | Admitting: Occupational Therapy

## 2016-10-24 DIAGNOSIS — M25611 Stiffness of right shoulder, not elsewhere classified: Secondary | ICD-10-CM | POA: Diagnosis not present

## 2016-10-24 DIAGNOSIS — R29898 Other symptoms and signs involving the musculoskeletal system: Secondary | ICD-10-CM | POA: Diagnosis present

## 2016-10-24 DIAGNOSIS — M25511 Pain in right shoulder: Secondary | ICD-10-CM

## 2016-10-24 NOTE — Patient Instructions (Signed)
Theraband strengthening: Complete 10-15X, 1-2X/day  1) Shoulder protraction  Anchor band in doorway, stand with back to door. Push your hand forward as much as you can to bringing your shoulder blades forward on your rib cage.     2) Shoulder flexion  While standing with back to the door, holding Theraband at hand level, raise arm in front of you.  Keep elbow straight through entire movement.      3) Shoulder horizontal abduction  Standing with a theraband anchored at chest height, begin with arm straight and some tension in the band. Move your arm out to your side (keeping straight the whole time). Bring the affected arm back to midline.     4) Shoulder Internal Rotation  While holding an elastic band at your side with your elbow bent, start with your hand away from your stomach, then pull the band towards your stomach. Keep your elbow near your side the entire time.     5) Shoulder External Rotation  While holding an elastic band at your side with your elbow bent, start with your hand near your stomach and then pull the band away. Keep your elbow at your side the entire time.     6) Shoulder abduction  While holding an elastic band at your side, draw up your arm to the side keeping your elbow straight.         Repeat all exercises 10-15 times, 1-2 times per day.   1) Shoulder Protraction    Begin with elbows by your side, slowly "punch" straight out in front of you keeping arms/elbows straight.      2) Shoulder Flexion  Supine:     Standing:         Begin with arms at your side with thumbs pointed up, slowly raise both arms up and forward towards overhead.               3) Horizontal abduction/adduction  Supine:   Standing:           Begin with arms straight out in front of you, bring out to the side in at "T" shape. Keep arms straight entire time.                 4) Internal & External Rotation    *No band*  -Stand with elbows at the side and elbows bent 90 degrees. Move your forearms away from your body, then bring back inward toward the body.     5) Shoulder Abduction  Supine:     Standing:       Lying on your back begin with your arms flat on the table next to your side. Slowly move your arms out to the side so that they go overhead, in a jumping jack or snow angel movement.    6) X to V arms (cheerleader move):  Begin with arms straight down, crossed in front of body in an "X". Keeping arms crossed, lift arms straight up overhead. Then spread arms apart into a "V" shape.  Bring back together into x and lower down to starting position.    7) Wall push-ups: arms straight out from shoulders onto wall. Standing around 18 inches from wall, begin wall push-ups.

## 2016-10-24 NOTE — Therapy (Signed)
[place], [place], [address] Phone: [phone]   Fax:  [phone]  Occupational Therapy Treatment  Patient Details  Name: BUCK MCAFFEE MRN: [id number] Date of Birth: 28-Nov-1990 Referring Provider: Arther Abbott, MD  Encounter Date: 10/24/2016      OT End of Session - 10/24/16 1349    Visit Number 3   Number of Visits 8   Date for OT Re-Evaluation 09/15/16   Authorization Type [employer] - 25 visit limit (02/13/16-02/11/17) 0 used    Authorization - Visit Number 3   Authorization - Number of Visits 25   OT Start Time 1300   OT Stop Time 1345   OT Time Calculation (min) 45 min   Activity Tolerance Patient tolerated treatment well   Behavior During Therapy [facility] for tasks assessed/performed      Past Medical History:  Diagnosis Date  . ADHD (attention deficit hyperactivity disorder)   . Depressed   . Dislocation of left shoulder joint   . Fracture of humeral head, left, closed     Past Surgical History:  Procedure Laterality Date  . ORIF HUMERUS FRACTURE Left 06/03/2015   Procedure: OPEN REDUCTION INTERNAL FIXATION LEFT PROXIMAL HUMERUS FRACTURE;  Surgeon: Carole Civil, MD;  Location: [facility];  Service: Orthopedics;  Laterality: Left;    There were no vitals filed for this visit.      Subjective Assessment - 10/24/16 1300    Subjective  S: I think I need to do more of the physical therapy.    Currently in Pain? Yes   Pain Score 6    Pain Location Shoulder   Pain Orientation Right   Pain Descriptors / Indicators Sore   Pain Type Acute pain   Pain Radiating Towards n/a   Pain Onset More than a month ago   Pain Frequency Intermittent   Aggravating Factors  reaching up, reaching back (er)   Pain Relieving Factors icy hot, rest    Effect of Pain on Daily Activities decreased ability to perform ADL tasks   Multiple Pain Sites No            [facility] OT Assessment - 10/24/16 1300      Assessment   Diagnosis Right shoulder instability     Precautions   Precautions Other (comment)   Precaution Comments Do not complete internal and external rotation with shoulder abduction     Palpation   Palpation comment Min fascial restrictions in right upper arm, trapezius, and scapularis region.     AROM   Overall AROM Comments Assessed seated. IR/er adducted.   AROM Assessment Site Shoulder   Right/Left Shoulder Right   Right Shoulder Flexion 170 Degrees  135 previous   Right Shoulder ABduction 175 Degrees  160 previous   Right Shoulder Internal Rotation 90 Degrees  same as previous   Right Shoulder External Rotation 90 Degrees  76 previous     PROM   Overall PROM Comments Assessed supine. IR/er adducted.   PROM Assessment Site Shoulder   Right/Left Shoulder Right   Right Shoulder Flexion 180 Degrees  160 previous   Right Shoulder ABduction 180 Degrees  same as previous   Right Shoulder Internal Rotation 90 Degrees  same as previous   Right Shoulder External Rotation 90 Degrees  same as previous     Strength   Overall Strength Comments Assessed seated. IR/er adducted.   Strength Assessment Site Shoulder   Right/Left Shoulder Right   Right  Shoulder Flexion 4+/5   Right Shoulder ABduction 4+/5   Right Shoulder Internal Rotation 4/5   Right Shoulder External Rotation 4/5                  OT Treatments/Exercises (OP) - 10/24/16 1347      Exercises   Exercises Shoulder     Shoulder Exercises: Supine   Protraction PROM;5 reps   Horizontal ABduction PROM;5 reps   External Rotation PROM;5 reps   Internal Rotation PROM;5 reps   Flexion PROM;5 reps   ABduction PROM;5 reps     Shoulder Exercises: Standing   Protraction Strengthening;5 reps;Theraband;10 reps   Theraband Level (Shoulder Protraction) Level 3 (Green)   Protraction Weight (lbs) 2   Horizontal ABduction Strengthening;5 reps;Theraband;10 reps   Theraband Level (Shoulder Horizontal  ABduction) Level 3 (Green)   Horizontal ABduction Weight (lbs) 2   External Rotation Strengthening;5 reps;Theraband;10 reps   Theraband Level (Shoulder External Rotation) Level 3 (Green)   External Rotation Weight (lbs) 2   Internal Rotation Strengthening;5 reps;Theraband;10 reps   Theraband Level (Shoulder Internal Rotation) Level 3 (Green)   Internal Rotation Weight (lbs) 2   Flexion Strengthening;5 reps;Theraband;10 reps   Theraband Level (Shoulder Flexion) Level 3 (Green)   Shoulder Flexion Weight (lbs) 2   ABduction Strengthening;5 reps;Theraband;10 reps   Theraband Level (Shoulder ABduction) Level 3 (Green)   Shoulder ABduction Weight (lbs) 2     Shoulder Exercises: ROM/Strengthening   Wall Pushups 10 reps   X to V Arms 5x, 2# weight                OT Education - 10/24/16 1349    Education provided Yes   Education Details green theraband strengthening, shoulder strengthening with weights   Person(s) Educated Patient   Methods Explanation;Demonstration;Handout   Comprehension Verbalized understanding;Returned demonstration          OT Short Term Goals - 10/24/16 1351      OT SHORT TERM GOAL #1   Title Paitent will be educated and independent with HEP to increase functional mobility in RUE during daily tasks.   Time 4   Period Weeks   Status Achieved     OT SHORT TERM GOAL #2   Title Patient will increase A/ROM to WNL to increase ability to complete tasks above shoulder height.    Time 4   Period Weeks   Status Achieved     OT SHORT TERM GOAL #3   Title Patient will increase strength in RUE to 4+/5 to increase ability to lift lightweight items.   Time 4   Period Weeks   Status Partially Met     OT SHORT TERM GOAL #4   Title Patient will report a low pain level of less than 4/10 when completing functional reaching tasks with his RUE.    Time 4   Period Weeks   Status Not Met     OT SHORT TERM GOAL #5   Title Patient will decrease fascial  restrictions to Min amount in RUE to increase functional mobility   Time 4   Period Weeks   Status Achieved                  Plan - 10/24/16 1349    Clinical Impression Statement A: Reassessment completed this session. Pt has met 3/5 STGs and partially met an additonal STG, improvements noted in ROM and strength of RUE. Pt has only attended 2 treatment sessions since evaluation, discussed attendance and  further therapy options with pt. Pt is agreeable to discharge today with HEP, focusing on shoulder and scapular stability and strengthening at home. Pt educated extensively on new HEP including frequency and duration.     Plan P: Discharge pt   OT Home Exercise Plan 11/2: Scapular theraband exercises (red). 1/10: green theraband strengthening, shoulder strenghtening with weights.      Patient will benefit from skilled therapeutic intervention in order to improve the following deficits and impairments:  Decreased strength, Decreased range of motion, Pain, Impaired UE functional use, Increased fascial restricitons  Visit Diagnosis: Stiffness of right shoulder, not elsewhere classified  Other symptoms and signs involving the musculoskeletal system  Acute pain of right shoulder    Problem List Patient Active Problem List   Diagnosis Date Noted  . Fracture of greater tuberosity of left humerus   . CLOSED FRACTURE OF HEAD OF RADIUS 02/24/2008   Guadelupe Sabin, OTR/L  [phone] 10/24/2016, 1:53 PM  [place], [place], [address] Phone: [phone]   Fax:  [phone]  Name: JUSTO HENGEL MRN: [id number] Date of Birth: [birth year]     OCCUPATIONAL THERAPY DISCHARGE SUMMARY  Visits from Start of Care: 3  Current functional level related to goals / functional outcomes: See above. Pt demonstrates improvements in strength and ROM, as well as a decrease in fascial restrictions in the RUE. Pt is using  RUE for functional tasks.    Remaining deficits: Pt continues to experience pain at the end range when reaching for items in high cabinets. Pt also reports feelings of instability when reaching behind him, especially when dressing.    Education / Equipment: Pt provided with HEP including green theraband strengthening and shoulder strengthening with weights.  Plan: Patient agrees to discharge.  Patient goals were met. Patient is being discharged due to meeting the stated rehab goals.  ?????

## 2016-10-25 ENCOUNTER — Ambulatory Visit (HOSPITAL_COMMUNITY): Payer: BLUE CROSS/BLUE SHIELD | Admitting: Occupational Therapy

## 2016-10-30 ENCOUNTER — Encounter: Payer: Self-pay | Admitting: Orthopedic Surgery

## 2016-10-30 ENCOUNTER — Ambulatory Visit (INDEPENDENT_AMBULATORY_CARE_PROVIDER_SITE_OTHER): Payer: BLUE CROSS/BLUE SHIELD | Admitting: Orthopedic Surgery

## 2016-10-30 DIAGNOSIS — G8929 Other chronic pain: Secondary | ICD-10-CM | POA: Diagnosis not present

## 2016-10-30 DIAGNOSIS — M25512 Pain in left shoulder: Secondary | ICD-10-CM | POA: Diagnosis not present

## 2016-10-30 MED ORDER — HYDROCODONE-ACETAMINOPHEN 5-325 MG PO TABS: 1.0000 | ORAL_TABLET | ORAL | 0 refills | Status: DC | PRN

## 2016-10-30 MED ORDER — DIAZEPAM 5 MG PO TABS: 5.0000 mg | ORAL_TABLET | Freq: Four times a day (QID) | ORAL | 0 refills | Status: DC | PRN

## 2016-10-30 NOTE — Progress Notes (Signed)
Patient ID: George [name], male   DOB: 05-Jan-1991, 26 y.o.   MRN: [id number]  Chief Complaint  Patient presents with  . Follow-up    left shoulder trigger point    HPI George EdenRyan A Haupt is a 26 y.o. male.   HPI  26 year old male status post left shoulder open treatment internal fixation after multiple dislocations and history of right shoulder dislocation  Complains of pain when raising his left arm above his head  Has a history of trigger points also on the left parascapular region  Review of Systems Review of Systems  No history of fever or skin rash Long history of seizure no recent seizure activity  Physical Exam  Painful forward elevation of the left shoulder 120 with normal passive range of motion with mild crepitance in the subacromial space left shoulder remain stable muscle tone and strength are normal skin is intact good distal pulse no sensory abnormalities distally   MEDICAL DECISION MAKING  DIAGNOSIS  Shoulder pain with impingement syndrome or if recommend subacromial injection medications refilled  Meds ordered this encounter  Medications  . diazepam (VALIUM) 5 MG tablet    Sig: Take 1 tablet (5 mg total) by mouth every 6 (six) hours as needed for anxiety.    Dispense:  60 tablet    Refill:  0  . HYDROcodone-acetaminophen (NORCO) 5-325 MG tablet    Sig: Take 1 tablet by mouth every 4 (four) hours as needed for moderate pain.    Dispense:  90 tablet    Refill:  0   [place] controlled substance reporting system reviewed   PLAN(RISK)    Procedure note the subacromial injection shoulder left   Verbal consent was obtained to inject the  Left   Shoulder  Timeout was completed to confirm the injection site is a subacromial space of the  left  shoulder  Medication used Depo-Medrol 40 mg and lidocaine 1% 3 cc  Anesthesia was provided by ethyl chloride  The injection was performed in the left  posterior subacromial space. After pinning the  skin with alcohol and anesthetized the skin with ethyl chloride the subacromial space was injected using a 20-gauge needle. There were no complications  Sterile dressing was applied.

## 2016-10-30 NOTE — Patient Instructions (Signed)

## 2016-11-30 ENCOUNTER — Ambulatory Visit: Payer: Self-pay | Admitting: Orthopedic Surgery

## 2016-12-03 ENCOUNTER — Ambulatory Visit: Payer: Self-pay | Admitting: Orthopedic Surgery

## 2016-12-05 ENCOUNTER — Ambulatory Visit: Payer: Self-pay | Admitting: Orthopedic Surgery

## 2016-12-28 ENCOUNTER — Encounter (HOSPITAL_COMMUNITY): Payer: Self-pay

## 2016-12-28 ENCOUNTER — Ambulatory Visit: Payer: Self-pay | Admitting: Orthopedic Surgery

## 2016-12-28 ENCOUNTER — Emergency Department (HOSPITAL_COMMUNITY)
Admission: EM | Admit: 2016-12-28 | Discharge: 2016-12-29 | Disposition: A | Discharge status: Home / Self Care | Payer: BLUE CROSS/BLUE SHIELD | Attending: Emergency Medicine | Admitting: Emergency Medicine

## 2016-12-28 DIAGNOSIS — F17200 Nicotine dependence, unspecified, uncomplicated: Secondary | ICD-10-CM | POA: Diagnosis not present

## 2016-12-28 DIAGNOSIS — F10120 Alcohol abuse with intoxication, uncomplicated: Secondary | ICD-10-CM | POA: Diagnosis not present

## 2016-12-28 DIAGNOSIS — R4182 Altered mental status, unspecified: Secondary | ICD-10-CM | POA: Diagnosis present

## 2016-12-28 DIAGNOSIS — Z79899 Other long term (current) drug therapy: Secondary | ICD-10-CM | POA: Insufficient documentation

## 2016-12-28 DIAGNOSIS — F10920 Alcohol use, unspecified with intoxication, uncomplicated: Secondary | ICD-10-CM

## 2016-12-28 DIAGNOSIS — F909 Attention-deficit hyperactivity disorder, unspecified type: Secondary | ICD-10-CM | POA: Insufficient documentation

## 2016-12-28 LAB — COMPREHENSIVE METABOLIC PANEL
ALBUMIN: 4.8 g/dL (ref 3.5–5.0)
ALT: 30 U/L (ref 17–63)
AST: 23 U/L (ref 15–41)
Alkaline Phosphatase: 72 U/L (ref 38–126)
Anion gap: 9 (ref 5–15)
BUN: 12 mg/dL (ref 6–20)
CHLORIDE: 108 mmol/L (ref 101–111)
CO2: 31 mmol/L (ref 22–32)
CREATININE: 0.66 mg/dL (ref 0.61–1.24)
Calcium: 9.7 mg/dL (ref 8.9–10.3)
GFR calc Af Amer: >60 mL/min (ref >60)
GLUCOSE: 119 mg/dL — AB (ref 65–99)
POTASSIUM: 4.1 mmol/L (ref 3.5–5.1)
Sodium: 148 mmol/L — ABNORMAL HIGH (ref 135–145)
Total Bilirubin: 0.3 mg/dL (ref 0.3–1.2)
Total Protein: 8.1 g/dL (ref 6.5–8.1)

## 2016-12-28 LAB — CBC WITH DIFFERENTIAL/PLATELET
Basophils Absolute: 0.1 10*3/uL (ref 0.0–0.1)
Basophils Relative: 1 %
EOS PCT: 1 %
Eosinophils Absolute: 0.2 10*3/uL (ref 0.0–0.7)
HEMATOCRIT: 42.9 % (ref 39.0–52.0)
Hemoglobin: 14.9 g/dL (ref 13.0–17.0)
LYMPHS ABS: 3.3 10*3/uL (ref 0.7–4.0)
LYMPHS PCT: 31 %
MCH: 32 pg (ref 26.0–34.0)
MCHC: 34.7 g/dL (ref 30.0–36.0)
MCV: 92.3 fL (ref 78.0–100.0)
MONO ABS: 0.5 10*3/uL (ref 0.1–1.0)
MONOS PCT: 5 %
Neutro Abs: 6.5 10*3/uL (ref 1.7–7.7)
Neutrophils Relative %: 62 %
Platelets: 394 10*3/uL (ref 150–400)
RBC: 4.65 MIL/uL (ref 4.22–5.81)
RDW: 12.9 % (ref 11.5–15.5)
WBC: 10.4 10*3/uL (ref 4.0–10.5)

## 2016-12-28 LAB — ETHANOL: ALCOHOL ETHYL (B): 371 mg/dL — AB (ref <5)

## 2016-12-28 MED ORDER — SODIUM CHLORIDE 0.9 % IV SOLN
1000.0000 mL | Freq: Once | INTRAVENOUS | Status: AC
  Administered 2016-12-28: 1000 mL via INTRAVENOUS

## 2016-12-28 MED ORDER — ESCITALOPRAM OXALATE 20 MG PO TABS
20.0000 mg | ORAL_TABLET | Freq: Every day | ORAL | Status: DC
  Filled 2016-12-28 (×3): qty 1

## 2016-12-28 MED ORDER — LEVETIRACETAM 500 MG PO TABS: 500.0000 mg | ORAL_TABLET | Freq: Two times a day (BID) | ORAL | Status: DC

## 2016-12-28 NOTE — ED Provider Notes (Signed)
[facility] Provider Note   CSN: [id number] Arrival date & time: 12/28/16  2141     History   Chief Complaint Chief Complaint  Patient presents with  . Medical Clearance    HPI George [name] is a 26 y.o. male.  HPI Patient presents under custody of the Police Department after he was arrested for disorderly conduct. Per report the patient was found in the women's bathroom of a [employer] store, drinking Everclear alcohol, and eating ice cream. The patient himself is awake and alert, laughing, writing nonsensical answers to questions, stating that he is all right, denying pain, but is clinically intoxicated. Police Department requests blood work for evaluation of appropriate transfer to jail.   Past Medical History:  Diagnosis Date  . ADHD (attention deficit hyperactivity disorder)   . Depressed   . Dislocation of left shoulder joint   . Fracture of humeral head, left, closed     Patient Active Problem List   Diagnosis Date Noted  . Fracture of greater tuberosity of left humerus   . CLOSED FRACTURE OF HEAD OF RADIUS 02/24/2008    Past Surgical History:  Procedure Laterality Date  . ORIF HUMERUS FRACTURE Left 06/03/2015   Procedure: OPEN REDUCTION INTERNAL FIXATION LEFT PROXIMAL HUMERUS FRACTURE;  Surgeon: Vickki HearingStanley E Harrison, MD;  Location: [facility];  Service: Orthopedics;  Laterality: Left;       Home Medications    Prior to Admission medications   Medication Sig Start Date End Date Taking? Authorizing Provider  acetaminophen (TYLENOL) 500 MG tablet Take 500 mg by mouth 2 (two) times daily as needed.    Historical Provider, MD  amphetamine-dextroamphetamine (ADDERALL) 10 MG tablet Take 10 mg by mouth daily with breakfast.    Historical Provider, MD  diazepam (VALIUM) 5 MG tablet Take 1 tablet (5 mg total) by mouth every 6 (six) hours as needed for anxiety. 10/30/16   Vickki HearingStanley E Harrison, MD  escitalopram (LEXAPRO) 20 MG tablet Take 20 mg by mouth  daily.    Historical Provider, MD  etodolac (LODINE) 500 MG tablet Take 1 tablet (500 mg total) by mouth 2 (two) times daily. 09/04/16   Vickki HearingStanley E Harrison, MD  HYDROcodone-acetaminophen (NORCO) 5-325 MG tablet Take 1 tablet by mouth every 4 (four) hours as needed for moderate pain. 10/30/16   Vickki HearingStanley E Harrison, MD  levETIRAcetam (KEPPRA) 500 MG tablet Take 1 tablet (500 mg total) by mouth 2 (two) times daily. 10/28/15   Layla MawKristen N Ward, DO    Family History History reviewed. No pertinent family history.  Social History Social History  Substance Use Topics  . Smoking status: Former Smoker    Packs/day: 1.00  . Smokeless tobacco: Current User  . Alcohol use 0.0 oz/week     Allergies   Patient has no known allergies.   Review of Systems Review of Systems  Unable to perform ROS: Other  Clinically intoxicated   Physical Exam Updated Vital Signs BP (!) 147/92 (BP Location: Left Arm)   Pulse (!) 122   Temp 99 F (37.2 C) (Oral)   Resp 20   Wt 209 lb (94.8 kg)   SpO2 92%   BMI 28.35 kg/m   Physical Exam  Constitutional: He appears well-developed. No distress.  Young male sitting upright in bed, laughing, smiling, giggling, offering nonsensical responses.  HENT:  Head: Normocephalic and atraumatic.  Eyes: Conjunctivae and EOM are normal.  Pulmonary/Chest: Effort normal. No stridor. No respiratory distress.  Abdominal: He exhibits no distension.  Musculoskeletal: He exhibits no deformity.  Neurological: He is alert.  Patient was ultimately spontaneously, has brief, clear, speech. However, the patient answers questions appropriately, is laughing, providing inappropriate, inconsistent responses.   Skin: Skin is warm and dry.  Psychiatric:  Patient clinically impaired, but not overtly aggressive, agitated.   Nursing note and vitals reviewed.    ED Treatments / Results  Labs (all labs ordered are listed, but only abnormal results are displayed) Labs Reviewed    COMPREHENSIVE METABOLIC PANEL  ETHANOL  CBC WITH DIFFERENTIAL/PLATELET  RAPID URINE DRUG SCREEN, HOSP PERFORMED     Procedures Procedures (including critical care time)   Initial Impression / Assessment and Plan / ED Course  I have reviewed the triage vital signs and the nursing notes.  Pertinent labs & imaging results that were available during my care of the patient were reviewed by me and considered in my medical decision making (see chart for details).  MDM male presents in custody of the police department, with concern for disorderly behavior, altered mental status. Here the patient is awake and alert, no providing nonsensical responses. BASIC labs performed to facilitate evaluation, and she will there are no other substance abnormalities, including infection, metabolic abnormalities. Disposition pending these results, and clinical improvement.   Final Clinical Impressions(s) / ED Diagnoses  Altered mental status Likely alcohol intoxication   Gerhard Munch, MD 12/28/16 2205

## 2016-12-28 NOTE — ED Triage Notes (Signed)
He has been placed in jail and he is drunk.  We just need medical clearance.

## 2016-12-28 NOTE — ED Notes (Signed)
CRITICAL VALUE ALERT  Critical value received:  ETOH: 371  Date of notification:  12/28/16  Time of notification:  2314  Critical value read back:Yes.    Nurse who received alert:  Ron, RN  MD notified (1st page):  Ranae PalmsYelverton  Time of first page:  2315  MD notified (2nd page):  Time of second page:  Responding MD:  Ranae PalmsYelverton  Time MD responded:  Ranae PalmsYelverton

## 2016-12-29 LAB — RAPID URINE DRUG SCREEN, HOSP PERFORMED
Amphetamines: NOT DETECTED
BARBITURATES: NOT DETECTED
BENZODIAZEPINES: POSITIVE — AB
COCAINE: NOT DETECTED
Opiates: NOT DETECTED
TETRAHYDROCANNABINOL: NOT DETECTED

## 2016-12-29 NOTE — Discharge Instructions (Signed)
Reduce your drinking. Return to the ED if you develop new or worsening symptoms.

## 2016-12-29 NOTE — ED Provider Notes (Signed)
Patient awake and alert. He is oriented to person, place and time. He denies any suicidal or homicidal thoughts. He is able to ambulate. Neurological exam is nonfocal.  He appears stable to discharge in police custody.  BP 111/76 (BP Location: Left Arm)   Pulse 90   Temp 98.2 F (36.8 C) (Oral)   Resp 16   Wt 209 lb (94.8 kg)   SpO2 94%   BMI 28.35 kg/m     Glynn OctaveStephen Samie Barclift, MD 12/29/16 [phone]

## 2016-12-29 NOTE — ED Notes (Signed)
PT states understanding of care given, follow up care. PT  ambulated from ED with officer wade to patrol car with a steady gait.

## 2017-01-21 ENCOUNTER — Ambulatory Visit: Payer: Self-pay | Admitting: Orthopedic Surgery

## 2017-04-02 ENCOUNTER — Ambulatory Visit: Payer: Self-pay | Admitting: Orthopedic Surgery

## 2017-04-11 IMAGING — CT CT SHOULDER*L* W/O CM
3 series · 13 of 20 positions shown, 15 images · non-contrast
Comparison: None.

CLINICAL DATA: Status post fall 1 week ago onto the left shoulder.

EXAM:
CT OF THE LEFT SHOULDER WITHOUT CONTRAST
TECHNIQUE: Multidetector CT imaging was performed according to the standard
protocol. Multiplanar CT image reconstructions were also generated.

[Series 2: shoulder axial bone 1.5 recon at 2 · axial · 0.54mm/px · z∈[+909,+1037]mm · 5 of 97 slices shown, 7 images]
[im 17/97  soft-tissue]
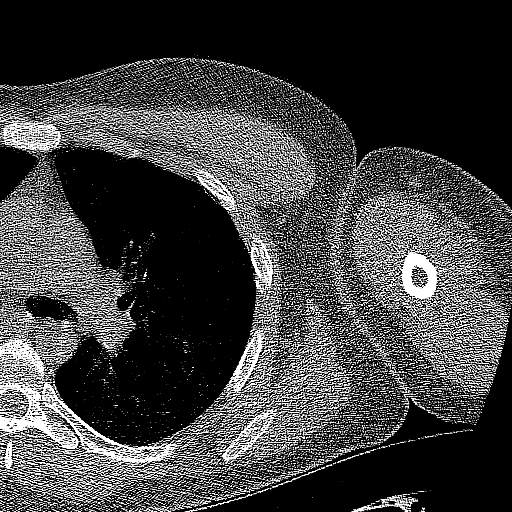
[im 17/97  bone]
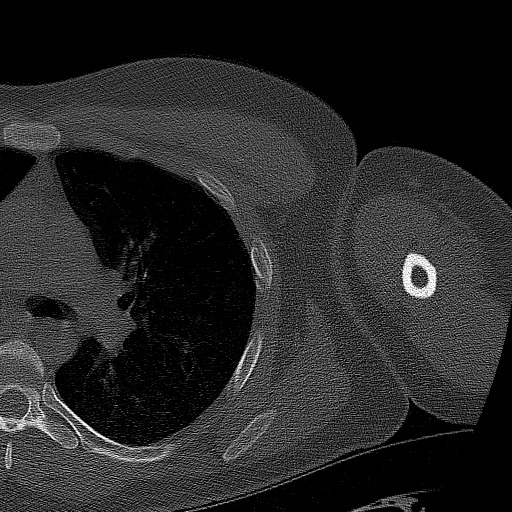
[im 33/97  bone]
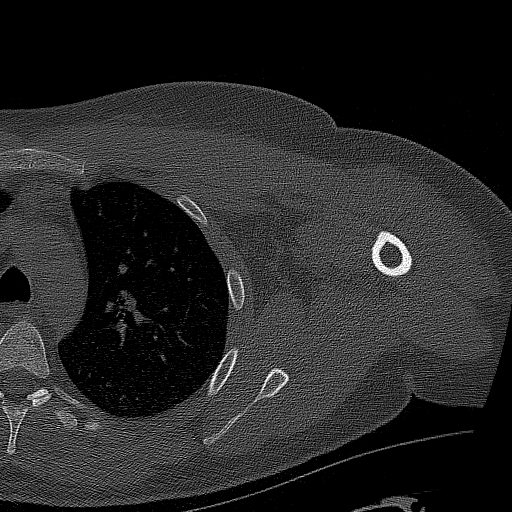
[im 49/97  bone]
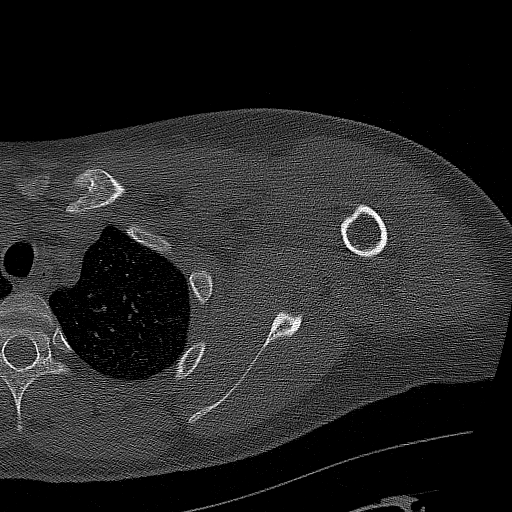
[im 65/97  bone]
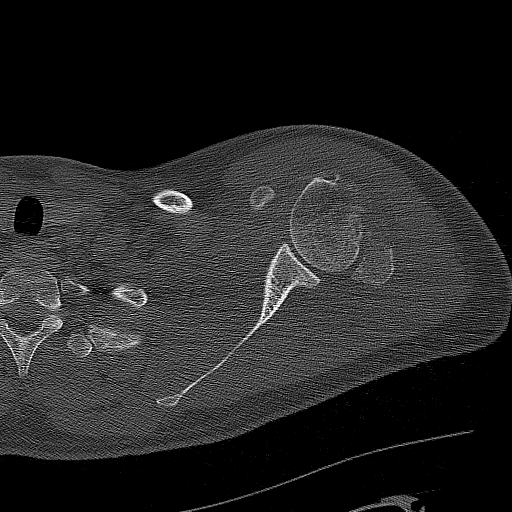
[im 81/97  soft-tissue]
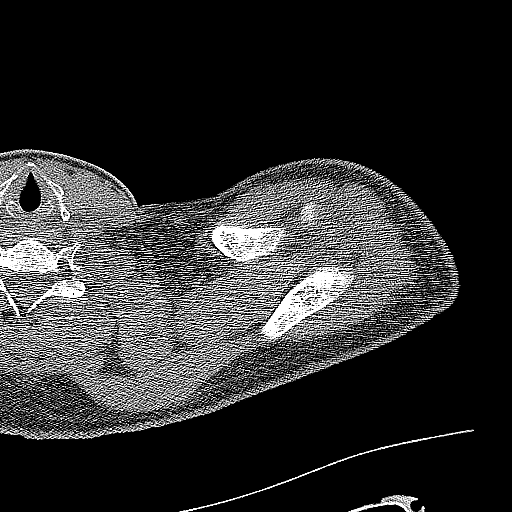
[im 81/97  bone]
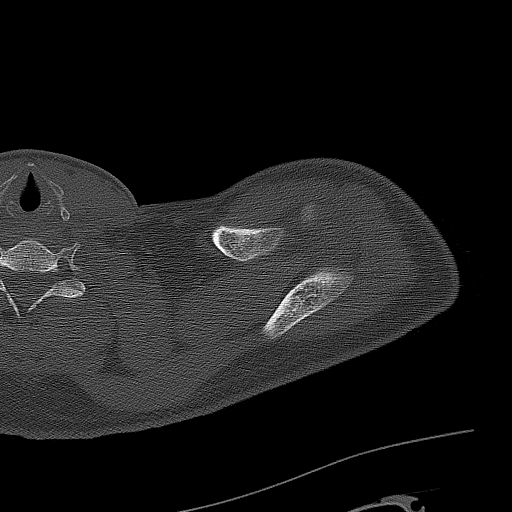

[Series 5: shoulder axial st 2.0 · axial · 0.44mm/px · z∈[+877,+1000]mm · 5 of 95 slices shown]
[im 16/95  bone]
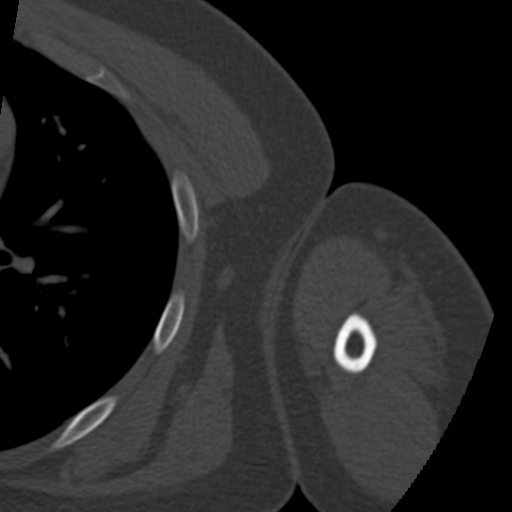
[im 32/95  bone]
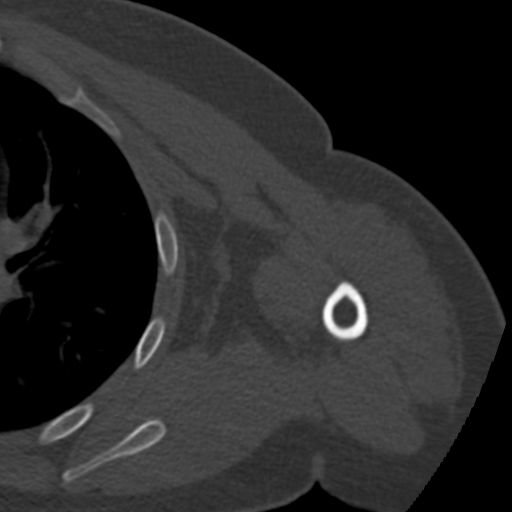
[im 48/95  bone]
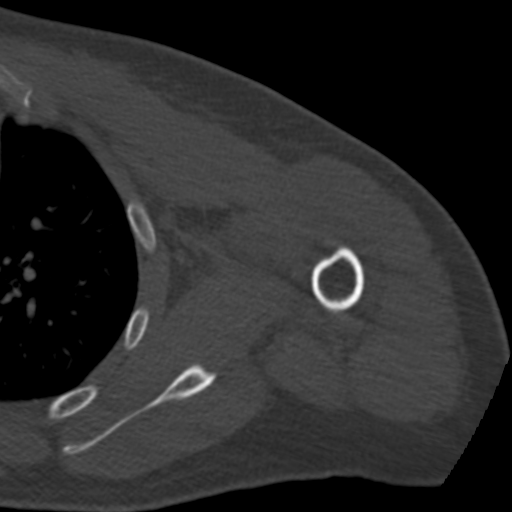
[im 63/95  bone]
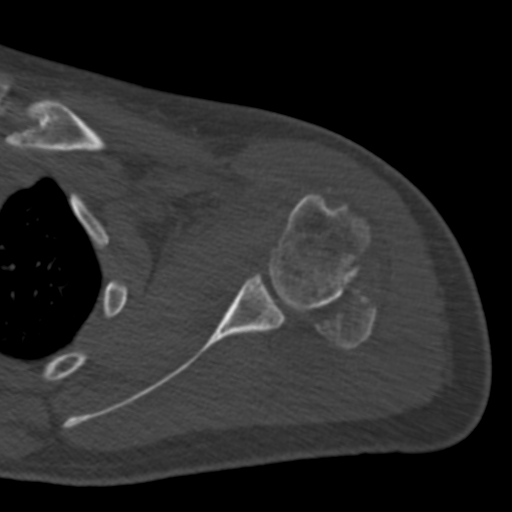
[im 79/95  bone]
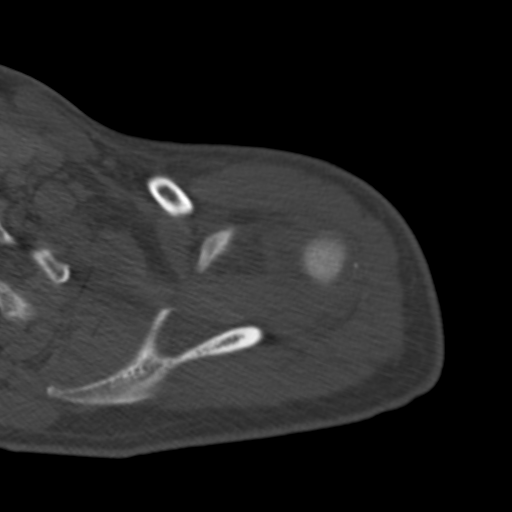

[Series 6: shoulder coronal st 2.0 · coronal · 0.39mm/px · 3 of 100 slices shown]
[im 20/100  bone]
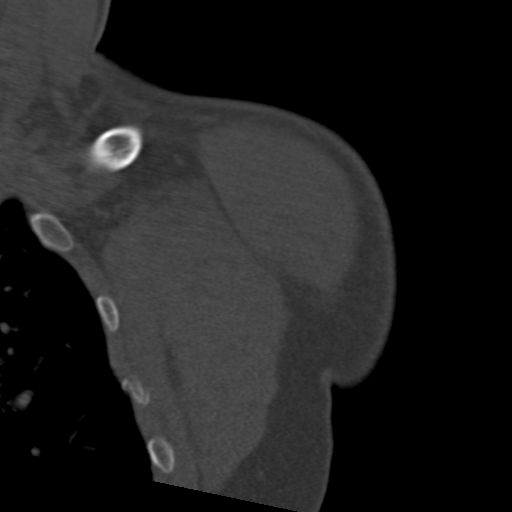
[im 40/100  bone]
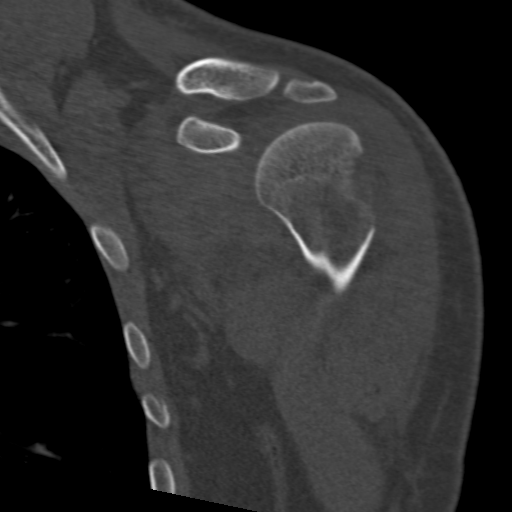
[im 60/100  bone]
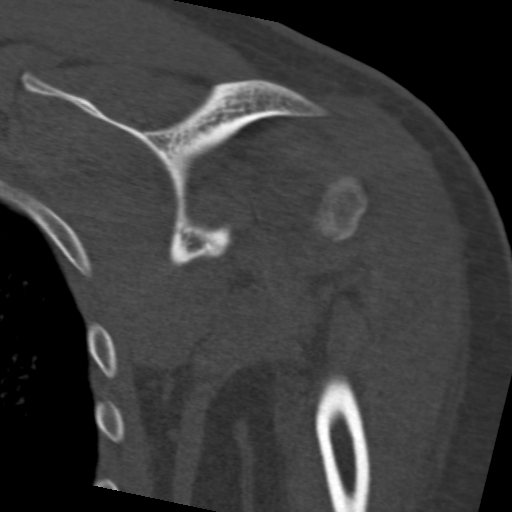

[13 of 20 positions shown; findings below may reference images not displayed]

FINDINGS: There is a mildly comminuted left humeral head fracture. The major
fracture fragment involves the greater tuberosity which is
posteriorly displaced by approximately 2 cm. There are a few
punctate bony fragments in the anterior glenohumeral joint space.
There is irregularity of the anterior inferior glenoid consistent
with a nondisplaced fracture (bony Bankart lesion).

There is no other fracture. There is no dislocation. The
acromioclavicular joint is normal. There is a Type II acromion.
There is no fluid collection or hematoma. The musculature is normal.

The visualized left lung is clear.
IMPRESSION: 1. Mildly comminuted, 2 part, left humeral head fracture involving
the greater tuberosity.
2. Irregularity of the anterior inferior glenoid consistent with a
nondisplaced fracture (bony Bankart lesion).

## 2017-04-19 IMAGING — RF DG C-ARM 61-120 MIN
1 series · 10 of 10 positions shown · non-contrast
Comparison: CT left shoulder May 26, 2015

FLUOROSCOPY TIME:  1 minutes 37 seconds; 10 submitted images

CLINICAL DATA: Open reduction internal fixation for greater
tuberosity fracture

EXAM:
DG C-ARM 61-120 MIN; LEFT SHOULDER - 2+ VIEW

[Series 1: run · 10 of 10 slices shown]
[im 1/10]
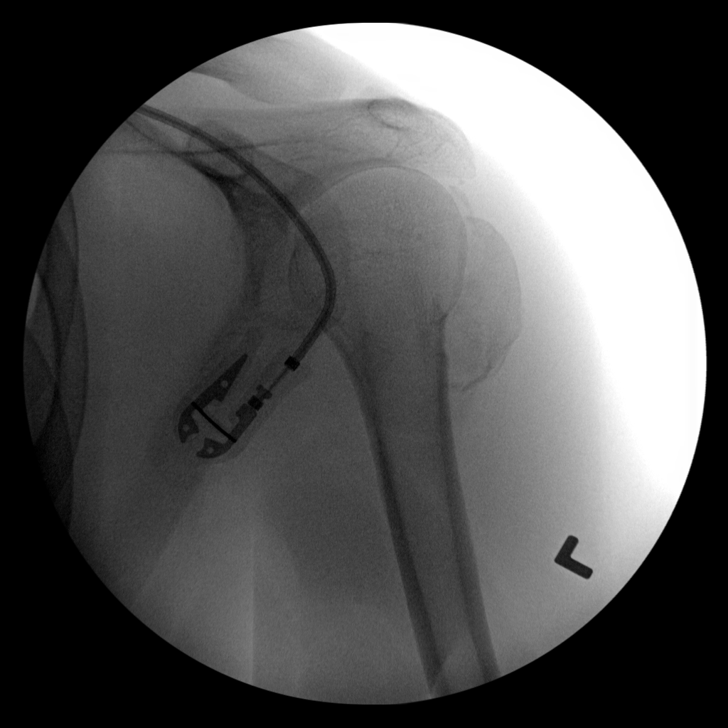
[im 2/10]
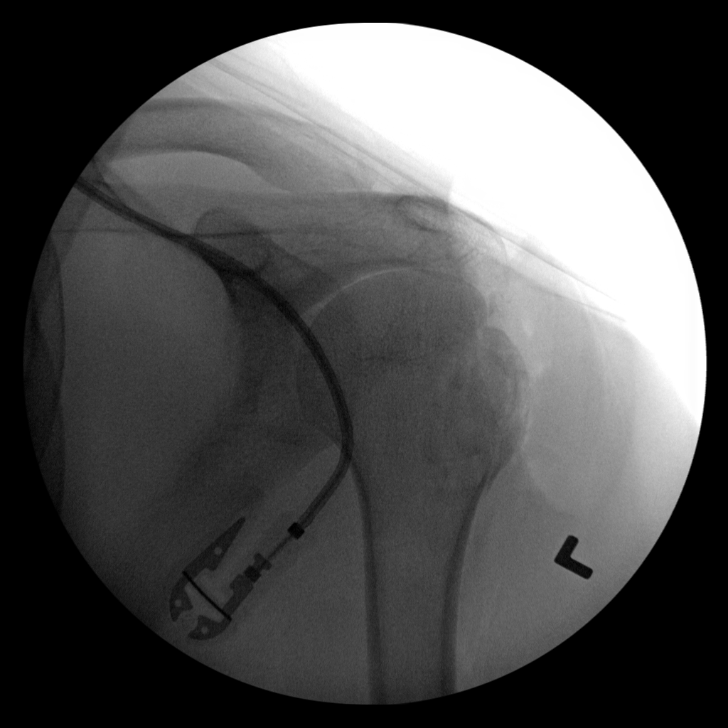
[im 3/10]
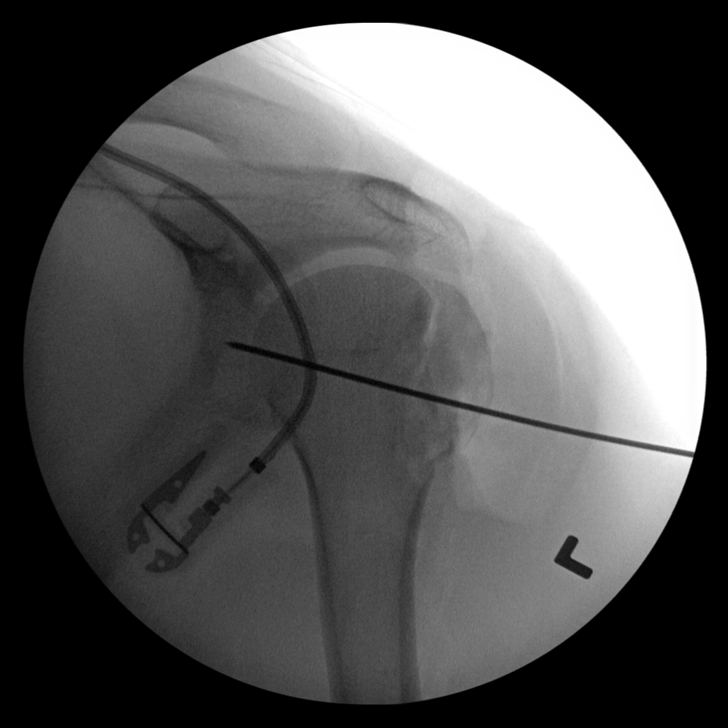
[im 4/10]
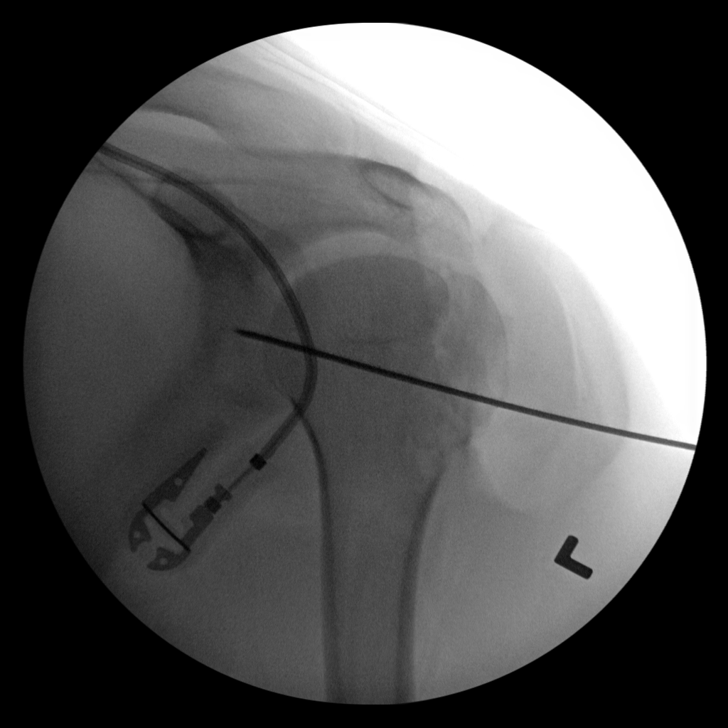
[im 5/10]
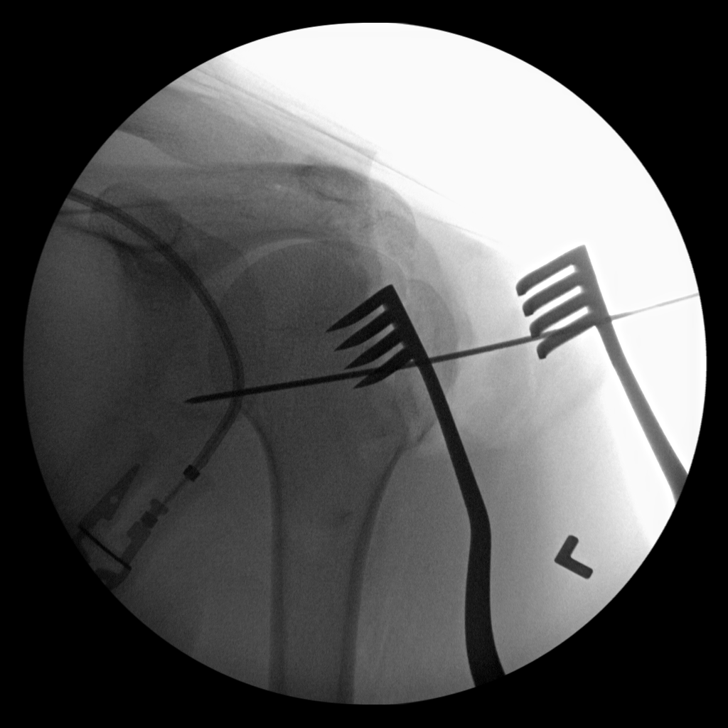
[im 6/10]
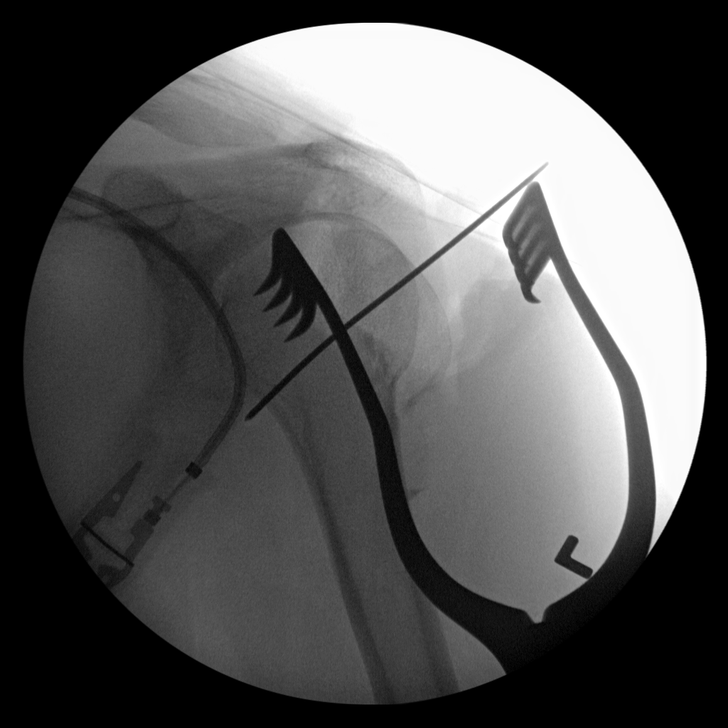
[im 7/10]
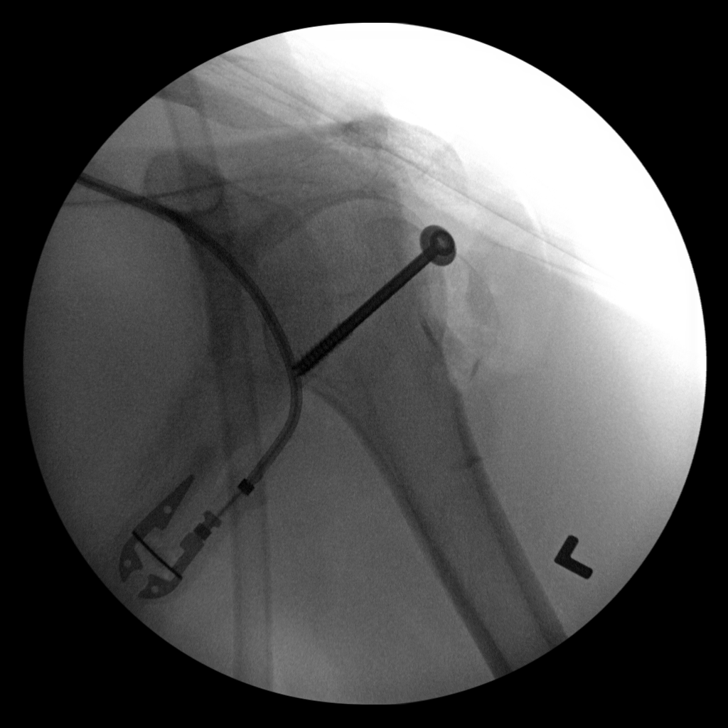
[im 8/10]
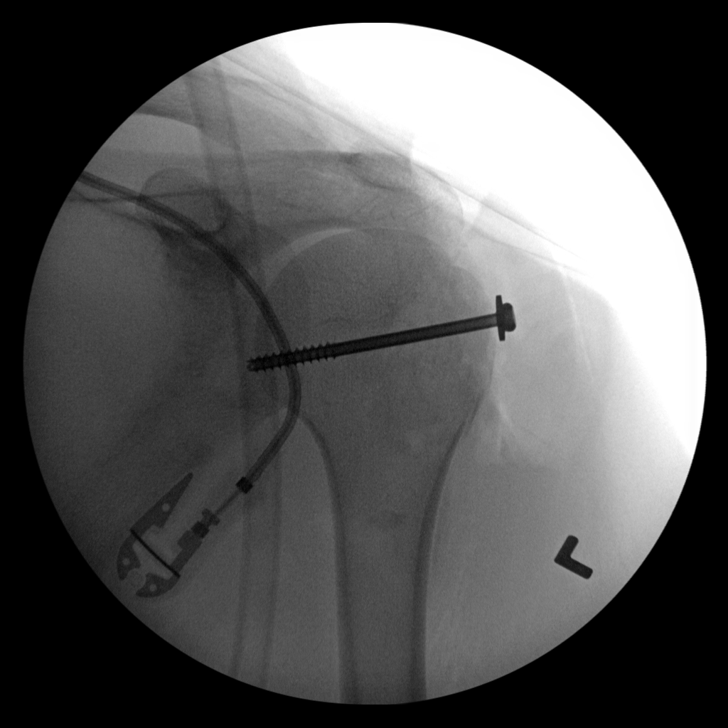
[im 9/10]
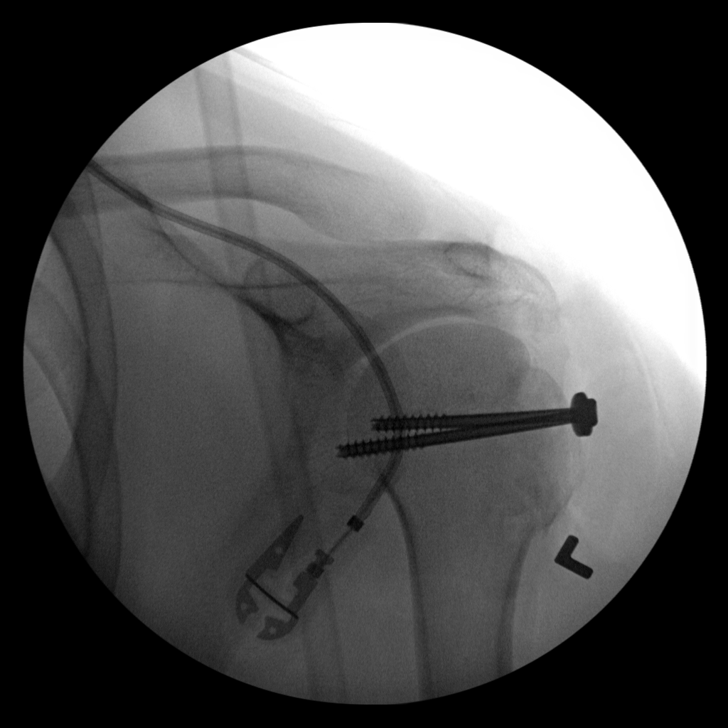
[im 10/10]
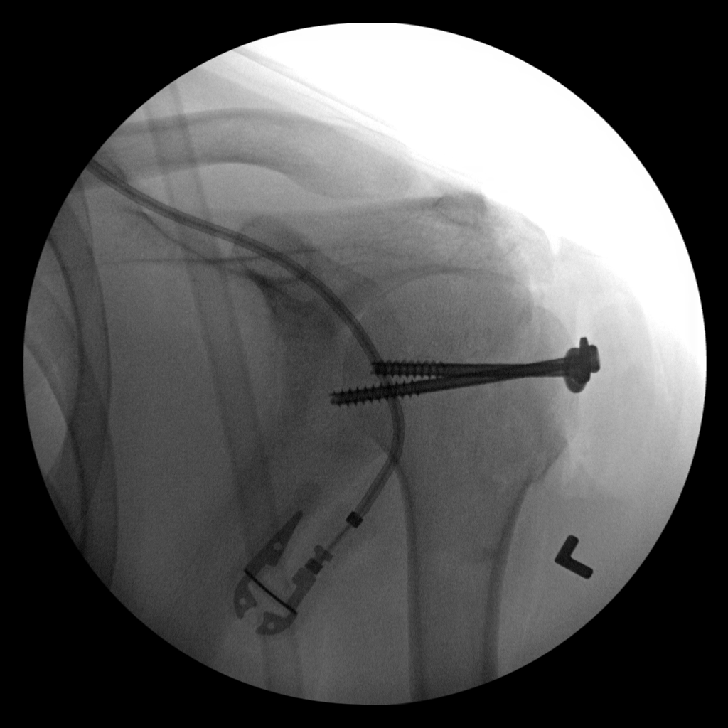

[10 of 10 positions shown; findings below may reference images not displayed]

FINDINGS: Internal rotation external rotation images show screw fixation
through a fracture of the greater tuberosity. Alignment essentially
anatomic following screw placement. No dislocation. No new fracture.
No appreciable joint space narrowing.
IMPRESSION: Screw fixation through a fracture of the greater tuberosity with
alignment essentially anatomic. No dislocation. No appreciable
arthropathic change.

## 2017-09-13 IMAGING — CR DG SHOULDER 2+V PORT*R*
1 series · 2 of 2 positions shown · non-contrast
Comparison: Radiograph dated 10/28/2015

CLINICAL DATA: 24-year-old male status post reduction of right
shoulder dislocation.

EXAM:
PORTABLE RIGHT SHOULDER - 2+ VIEW

[Series 2: scapula y · 0.17mm/px · 2 of 2 slices shown]
[im 1/2]
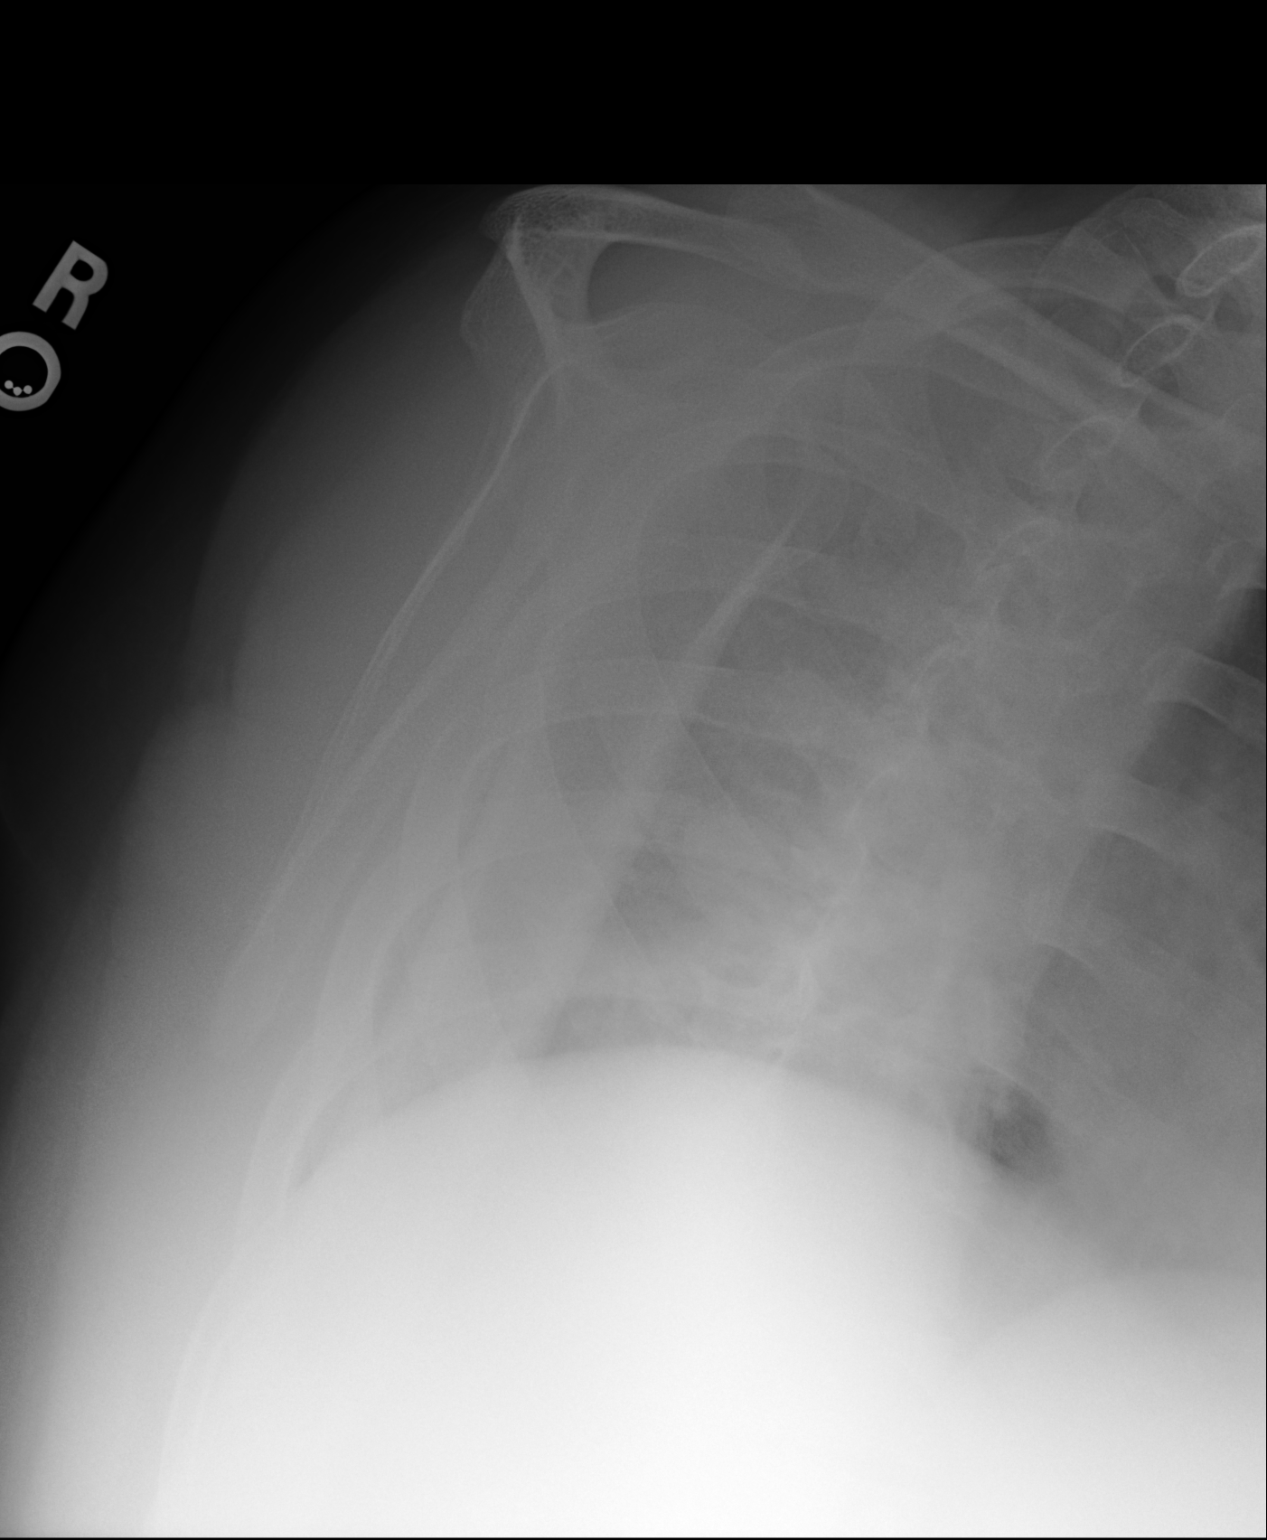
[im 2/2]
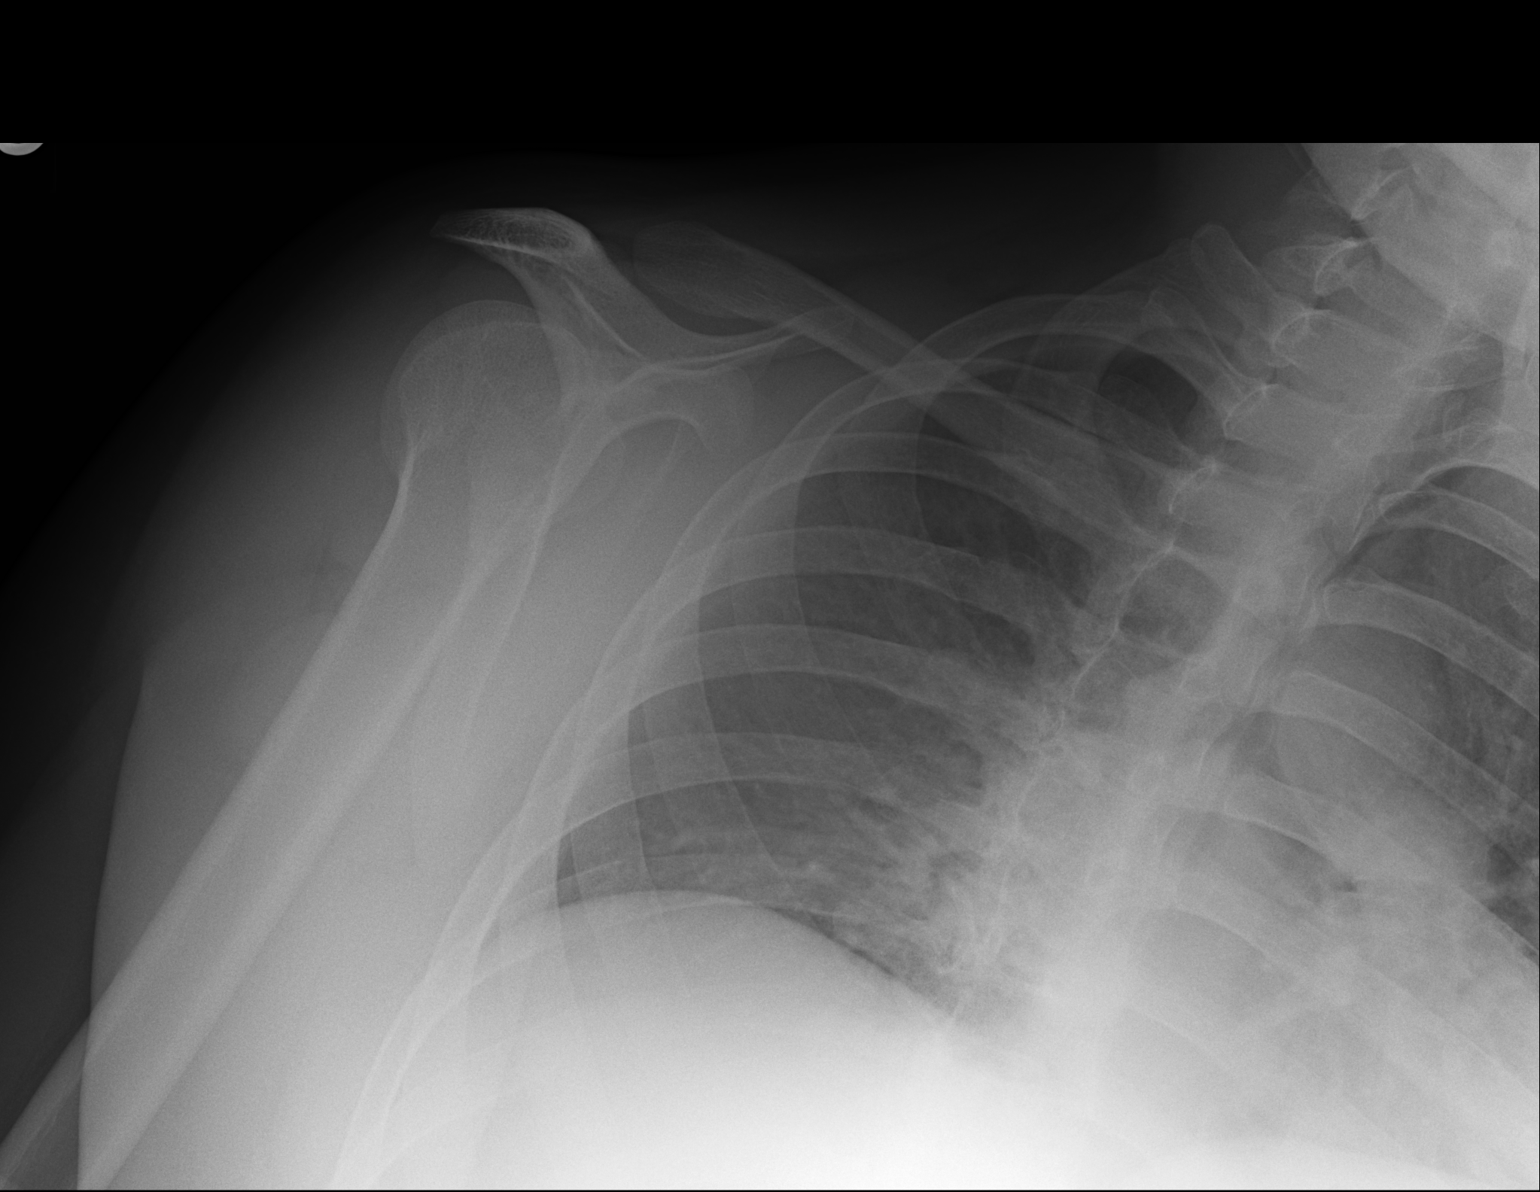

[2 of 2 positions shown; findings below may reference images not displayed]

FINDINGS: Evaluation of the right shoulder is limited as standard Y-view and
axial projections are not provided. There has been interval
reduction of the previously seen right anterior shoulder
dislocation.
IMPRESSION: Interval reduction of the previously seen right shoulder
dislocation.

## 2018-07-22 ENCOUNTER — Encounter (HOSPITAL_COMMUNITY): Payer: Self-pay | Admitting: Emergency Medicine

## 2018-07-22 ENCOUNTER — Other Ambulatory Visit: Payer: Self-pay

## 2018-07-22 ENCOUNTER — Emergency Department (HOSPITAL_COMMUNITY)
Admission: EM | Admit: 2018-07-22 | Discharge: 2018-07-23 | Disposition: A | Discharge status: Home / Self Care | Payer: BLUE CROSS/BLUE SHIELD | Attending: Emergency Medicine | Admitting: Emergency Medicine

## 2018-07-22 DIAGNOSIS — Z79899 Other long term (current) drug therapy: Secondary | ICD-10-CM | POA: Insufficient documentation

## 2018-07-22 DIAGNOSIS — Z87891 Personal history of nicotine dependence: Secondary | ICD-10-CM | POA: Insufficient documentation

## 2018-07-22 DIAGNOSIS — Z658 Other specified problems related to psychosocial circumstances: Secondary | ICD-10-CM

## 2018-07-22 DIAGNOSIS — F1020 Alcohol dependence, uncomplicated: Secondary | ICD-10-CM | POA: Insufficient documentation

## 2018-07-22 DIAGNOSIS — Z7689 Persons encountering health services in other specified circumstances: Secondary | ICD-10-CM | POA: Insufficient documentation

## 2018-07-22 NOTE — ED Triage Notes (Signed)
Pt brought in via [employer]. Pt states he doesn't know why he is here and stats he was "just at work." Pt denies auditory or visual hallucinations. Pt denies SI/HI. Pt did come in the IVC paper work.

## 2018-07-23 LAB — COMPREHENSIVE METABOLIC PANEL
ALBUMIN: 4.6 g/dL (ref 3.5–5.0)
ALT: 23 U/L (ref 0–44)
ANION GAP: 10 (ref 5–15)
AST: 25 U/L (ref 15–41)
Alkaline Phosphatase: 70 U/L (ref 38–126)
BILIRUBIN TOTAL: 0.5 mg/dL (ref 0.3–1.2)
BUN: 10 mg/dL (ref 6–20)
CHLORIDE: 103 mmol/L (ref 98–111)
CO2: 26 mmol/L (ref 22–32)
Calcium: 9.3 mg/dL (ref 8.9–10.3)
Creatinine, Ser: 1 mg/dL (ref 0.61–1.24)
GFR calc Af Amer: >60 mL/min (ref >60)
Glucose, Bld: 104 mg/dL — ABNORMAL HIGH (ref 70–99)
POTASSIUM: 3.5 mmol/L (ref 3.5–5.1)
Sodium: 139 mmol/L (ref 135–145)
TOTAL PROTEIN: 7.5 g/dL (ref 6.5–8.1)

## 2018-07-23 LAB — RAPID URINE DRUG SCREEN, HOSP PERFORMED
AMPHETAMINES: NOT DETECTED
Barbiturates: NOT DETECTED
Benzodiazepines: POSITIVE — AB
COCAINE: NOT DETECTED
OPIATES: NOT DETECTED
Tetrahydrocannabinol: POSITIVE — AB

## 2018-07-23 LAB — CBC WITH DIFFERENTIAL/PLATELET
ABS IMMATURE GRANULOCYTES: 0.05 10*3/uL (ref 0.00–0.07)
Basophils Absolute: 0.1 10*3/uL (ref 0.0–0.1)
Basophils Relative: 1 %
EOS PCT: 0 %
Eosinophils Absolute: 0 10*3/uL (ref 0.0–0.5)
HEMATOCRIT: 41.8 % (ref 39.0–52.0)
HEMOGLOBIN: 14 g/dL (ref 13.0–17.0)
Immature Granulocytes: 0 %
LYMPHS ABS: 3.1 10*3/uL (ref 0.7–4.0)
LYMPHS PCT: 23 %
MCH: 31.5 pg (ref 26.0–34.0)
MCHC: 33.5 g/dL (ref 30.0–36.0)
MCV: 93.9 fL (ref 80.0–100.0)
MONOS PCT: 7 %
Monocytes Absolute: 0.9 10*3/uL (ref 0.1–1.0)
NEUTROS ABS: 9.1 10*3/uL — AB (ref 1.7–7.7)
Neutrophils Relative %: 69 %
Platelets: 303 10*3/uL (ref 150–400)
RBC: 4.45 MIL/uL (ref 4.22–5.81)
RDW: 12.6 % (ref 11.5–15.5)
WBC: 13.2 10*3/uL — ABNORMAL HIGH (ref 4.0–10.5)
nRBC: 0 % (ref 0.0–0.2)

## 2018-07-23 LAB — URINALYSIS, ROUTINE W REFLEX MICROSCOPIC
Bilirubin Urine: NEGATIVE
Glucose, UA: NEGATIVE mg/dL
HGB URINE DIPSTICK: NEGATIVE
Ketones, ur: NEGATIVE mg/dL
Leukocytes, UA: NEGATIVE
Nitrite: NEGATIVE
PH: 5 (ref 5.0–8.0)
Protein, ur: NEGATIVE mg/dL
SPECIFIC GRAVITY, URINE: 1.005 (ref 1.005–1.030)

## 2018-07-23 LAB — ETHANOL

## 2018-07-23 MED ORDER — AMPHETAMINE-DEXTROAMPHETAMINE 10 MG PO TABS
10.0000 mg | ORAL_TABLET | Freq: Every day | ORAL | Status: DC
  Administered 2018-07-23: 10 mg via ORAL
  Filled 2018-07-23: qty 1

## 2018-07-23 NOTE — ED Notes (Signed)
Patient took shower, [employer] and sitter monitoring.

## 2018-07-23 NOTE — ED Notes (Signed)
Patient at nurses station asking for phone, informed him, it was a behavorial health rule, who couldn't have his cellphone, patient escorted back to room accompanied by [employer], mouthing off, "Yall think yall so tough".

## 2018-07-23 NOTE — ED Notes (Signed)
Pt given meal tray.

## 2018-07-23 NOTE — ED Notes (Signed)
tts in progress 

## 2018-07-23 NOTE — ED Notes (Signed)
[employer] was about to sign-off on patient, patient states "Bet you I walk", patient told to return to room, patient has one hand-cuff to stretcher, officer at bedside.
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
## 2018-07-23 NOTE — ED Notes (Signed)
Patient given dinner tray.

## 2018-07-23 NOTE — ED Notes (Signed)
Pt given breakfast tray
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
## 2018-07-29 ENCOUNTER — Encounter (HOSPITAL_COMMUNITY): Payer: Self-pay

## 2018-07-29 ENCOUNTER — Other Ambulatory Visit: Payer: Self-pay

## 2018-07-29 ENCOUNTER — Emergency Department (HOSPITAL_COMMUNITY)
Admission: EM | Admit: 2018-07-29 | Discharge: 2018-07-29 | Disposition: A | Discharge status: Home / Self Care | Payer: Self-pay | Attending: Emergency Medicine | Admitting: Emergency Medicine

## 2018-07-29 DIAGNOSIS — F17210 Nicotine dependence, cigarettes, uncomplicated: Secondary | ICD-10-CM | POA: Insufficient documentation

## 2018-07-29 DIAGNOSIS — F909 Attention-deficit hyperactivity disorder, unspecified type: Secondary | ICD-10-CM | POA: Insufficient documentation

## 2018-07-29 DIAGNOSIS — Z79899 Other long term (current) drug therapy: Secondary | ICD-10-CM | POA: Insufficient documentation

## 2018-07-29 DIAGNOSIS — Z760 Encounter for issue of repeat prescription: Secondary | ICD-10-CM | POA: Insufficient documentation

## 2018-07-29 DIAGNOSIS — Z0000 Encounter for general adult medical examination without abnormal findings: Secondary | ICD-10-CM

## 2018-07-29 MED ORDER — ESCITALOPRAM OXALATE 10 MG PO TABS
20.0000 mg | ORAL_TABLET | Freq: Once | ORAL | Status: AC
  Administered 2018-07-29: 20 mg via ORAL
  Filled 2018-07-29: qty 2

## 2018-07-29 MED ORDER — LEVETIRACETAM 500 MG PO TABS: 500.0000 mg | ORAL_TABLET | Freq: Two times a day (BID) | ORAL | 0 refills | Status: DC

## 2018-07-29 MED ORDER — LEVETIRACETAM IN NACL 500 MG/100ML IV SOLN
500.0000 mg | Freq: Once | INTRAVENOUS | Status: AC
  Administered 2018-07-29: 500 mg via INTRAVENOUS
  Filled 2018-07-29: qty 100

## 2018-07-29 MED ORDER — HYDROCODONE-ACETAMINOPHEN 5-325 MG PO TABS: 1.0000 | ORAL_TABLET | ORAL | 0 refills | Status: DC | PRN

## 2018-07-29 MED ORDER — LEVETIRACETAM 500 MG PO TABS
500.0000 mg | ORAL_TABLET | Freq: Once | ORAL | Status: DC
  Filled 2018-07-29: qty 1

## 2018-07-29 MED ORDER — ESCITALOPRAM OXALATE 20 MG PO TABS: 20.0000 mg | ORAL_TABLET | Freq: Every day | ORAL | 0 refills | Status: DC

## 2018-07-29 MED ORDER — AMPHETAMINE-DEXTROAMPHETAMINE 10 MG PO TABS: 10.0000 mg | ORAL_TABLET | Freq: Every day | ORAL | 0 refills | Status: DC

## 2018-07-29 MED ORDER — ACETAMINOPHEN 500 MG PO TABS
1000.0000 mg | ORAL_TABLET | Freq: Once | ORAL | Status: AC
  Administered 2018-07-29: 1000 mg via ORAL
  Filled 2018-07-29: qty 2

## 2018-07-29 MED ORDER — HYDROCODONE-ACETAMINOPHEN 5-325 MG PO TABS
1.0000 | ORAL_TABLET | Freq: Once | ORAL | Status: AC
  Administered 2018-07-29: 1 via ORAL
  Filled 2018-07-29: qty 1

## 2018-07-29 MED ORDER — AMPHETAMINE-DEXTROAMPHETAMINE 10 MG PO TABS
10.0000 mg | ORAL_TABLET | Freq: Every day | ORAL | Status: DC
  Administered 2018-07-29: 10 mg via ORAL
  Filled 2018-07-29: qty 1

## 2018-07-29 NOTE — ED Provider Notes (Signed)
[facility] EMERGENCY DEPARTMENT Provider Note   CSN: [id number] Arrival date & time: 07/29/18  1842     History   Chief Complaint Chief Complaint  Patient presents with  . Medication Refill    HPI George [name] is a 27 y.o. male.  HPI  Patient is a 27 year old male with a history of ADHD, depression, orthopedic polytrauma, presenting for medication administration.  Patient reports he is currently homeless, and report from triage RN via police suggest that patient was kicked out of his family's home.  Patient reports that he is homeless as of today.  He reports that he has diffuse pain, consistent with his chronic pain secondary to his orthopedic polytrauma.  He reports that he takes daily hydrocodone, Adderall, and benzodiazepines.  Denies any other medical complaints at this time.  Patient reports that he has stopped Lexapro and Keppra.  Past Medical History:  Diagnosis Date  . ADHD (attention deficit hyperactivity disorder)   . Depressed   . Dislocation of left shoulder joint   . Fracture of humeral head, left, closed     Patient Active Problem List   Diagnosis Date Noted  . Fracture of greater tuberosity of left humerus   . CLOSED FRACTURE OF HEAD OF RADIUS 02/24/2008    Past Surgical History:  Procedure Laterality Date  . ORIF HUMERUS FRACTURE Left 06/03/2015   Procedure: OPEN REDUCTION INTERNAL FIXATION LEFT PROXIMAL HUMERUS FRACTURE;  Surgeon: Vickki Hearing, MD;  Location: [facility];  Service: Orthopedics;  Laterality: Left;        Home Medications    Prior to Admission medications   Medication Sig Start Date End Date Taking? Authorizing Provider  acetaminophen (TYLENOL) 500 MG tablet Take 500 mg by mouth 2 (two) times daily as needed.    [provider]  amphetamine-dextroamphetamine (ADDERALL) 10 MG tablet Take 10 mg by mouth daily with breakfast.    [provider]  amphetamine-dextroamphetamine (ADDERALL) 10 MG tablet Take 1  tablet (10 mg total) by mouth daily with breakfast. 07/29/18   Donnetta Hutching, MD  diazepam (VALIUM) 5 MG tablet Take 1 tablet (5 mg total) by mouth every 6 (six) hours as needed for anxiety. 10/30/16   Vickki Hearing, MD  escitalopram (LEXAPRO) 20 MG tablet Take 20 mg by mouth daily.    [provider]  escitalopram (LEXAPRO) 20 MG tablet Take 1 tablet (20 mg total) by mouth daily. 07/29/18   Donnetta Hutching, MD  etodolac (LODINE) 500 MG tablet Take 1 tablet (500 mg total) by mouth 2 (two) times daily. 09/04/16   Vickki Hearing, MD  HYDROcodone-acetaminophen (NORCO) 5-325 MG tablet Take 1 tablet by mouth every 4 (four) hours as needed for moderate pain. 10/30/16   Vickki Hearing, MD  HYDROcodone-acetaminophen (NORCO/VICODIN) 5-325 MG tablet Take 1 tablet by mouth every 4 (four) hours as needed. 07/29/18   Donnetta Hutching, MD  levETIRAcetam (KEPPRA) 500 MG tablet Take 1 tablet (500 mg total) by mouth 2 (two) times daily. 10/28/15   Ward, Layla Maw, DO  levETIRAcetam (KEPPRA) 500 MG tablet Take 1 tablet (500 mg total) by mouth 2 (two) times daily. 07/29/18   Donnetta Hutching, MD    Family History No family history on file.  Social History Social History   Tobacco Use  . Smoking status: Current Some Day Smoker    Packs/day: 1.00  . Smokeless tobacco: Current User  Substance Use Topics  . Alcohol use: Yes    Alcohol/week: 0.0 standard  drinks    Comment: occ  . Drug use: Not Currently     Allergies   Patient has no known allergies.   Review of Systems Review of Systems  Constitutional: Negative for chills and fever.  Musculoskeletal: Positive for arthralgias, back pain and myalgias.  Skin: Negative for wound.     Physical Exam Updated Vital Signs BP (!) 123/91 (BP Location: Right Arm)   Pulse 93   Temp 97.9 F (36.6 C) (Oral)   Resp 16   Wt 81.6 kg   SpO2 100%   BMI 24.41 kg/m   Physical Exam  Constitutional: He appears well-developed and well-nourished. No  distress.  Sitting comfortably in bed.  HENT:  Head: Normocephalic and atraumatic.  Eyes: Conjunctivae and EOM are normal. Right eye exhibits no discharge. Left eye exhibits no discharge.  EOMs normal to gross examination. Pupils 4mm and equal bilaterally.  Neck: Normal range of motion.  Cardiovascular: Normal rate and regular rhythm.  Intact, 2+ radial pulse.  Pulmonary/Chest:  Normal respiratory effort. Patient converses comfortably. No audible wheeze or stridor.  Abdominal: He exhibits no distension.  Musculoskeletal: Normal range of motion.  Neurological: He is alert.  Cranial nerves intact to gross observation. Patient moves extremities without difficulty.  Skin: Skin is warm and dry. He is not diaphoretic.  Psychiatric:  Agitated posture. Loud speech but normal thought content and process.  Nursing note and vitals reviewed.    ED Treatments / Results  Labs (all labs ordered are listed, but only abnormal results are displayed) Labs Reviewed - No data to display  EKG None  Radiology No results found.  Procedures Procedures (including critical care time)  Medications Ordered in ED Medications  acetaminophen (TYLENOL) tablet 1,000 mg (1,000 mg Oral Given 07/29/18 1928)     Initial Impression / Assessment and Plan / ED Course  I have reviewed the triage vital signs and the nursing notes.  Pertinent labs & imaging results that were available during my care of the patient were reviewed by me and considered in my medical decision making (see chart for details).  Clinical Course as of Jul 30 1931  Tue Jul 29, 2018  1610 When offered medication, patient became verbally aggressive requiring security involvement for provider safety. Patient ambulating without difficulty. No further medical needs at this time.    [AM]    Clinical Course User Index [AM] Elisha Ponder, PA-C    Patient nontoxic appearing but agitated. Patient requesting his medications including  opioid pain medication. Patient received Rx for all home medications this morning. Patient received dose today. [facility] reviewed and found to have no chronic opioid or benzodiazepine Rx, and only periodic Rx for injury.  I discussed with patient that he already received Adderall today, and this is a once daily medication.  Discussed with patient that additional dose of Adderall cannot be ordered tonight. Patient in understanding.  Patient was ambulating normally, tolerating p.o., and and appears to have no acute medical complaints prior to discharge.  Patient was given resources for primary care including [facility], [facility], and health department.  Patient declined these resources, and elected to refuse his paperwork on the way out.  Final Clinical Impressions(s) / ED Diagnoses   Final diagnoses:  Medication refill    ED Discharge Orders    None       Delia Chimes 07/29/18 1944    Maia Plan, MD 07/30/18 1002

## 2018-07-29 NOTE — ED Triage Notes (Signed)
Pt states he keeps going to each pharmacy to get his medications filled and they will not fill them. When asked what he means by this he keeps stating "they keep bouncing me around."

## 2018-07-29 NOTE — ED Notes (Signed)
Pt got extremely agitated at [facility] when asking what he was here for. Pt has a large red suitcase with him. Have notified security to wand suitcase.

## 2018-07-29 NOTE — ED Triage Notes (Signed)
Pt requesting one of each of his medication to be given. Pt requests he has increase anxiety

## 2018-07-29 NOTE — ED Notes (Signed)
Pt became very anxious.Pt had large rolling luggage bag with him. Security asked to wand baggage.

## 2018-07-29 NOTE — ED Provider Notes (Signed)
[facility] EMERGENCY DEPARTMENT Provider Note   CSN: [id number] Arrival date & time: 07/29/18  8295     History   Chief Complaint Chief Complaint  Patient presents with  . Medication Refill    HPI George [name] is a 27 y.o. male.  Patient reports being out of his medication.  He allegedly has a primary care relationship with Dr. Carlena Sax.  Past medical history includes depression and ADHD.  He reports increased anxiety but no psychosis, homicidal or suicidal ideation.  He claims to be starting a job at [employer] today.  No other complaints at this time.  Severity of symptoms is mild to moderate.     Past Medical History:  Diagnosis Date  . ADHD (attention deficit hyperactivity disorder)   . Depressed   . Dislocation of left shoulder joint   . Fracture of humeral head, left, closed     Patient Active Problem List   Diagnosis Date Noted  . Fracture of greater tuberosity of left humerus   . CLOSED FRACTURE OF HEAD OF RADIUS 02/24/2008    Past Surgical History:  Procedure Laterality Date  . ORIF HUMERUS FRACTURE Left 06/03/2015   Procedure: OPEN REDUCTION INTERNAL FIXATION LEFT PROXIMAL HUMERUS FRACTURE;  Surgeon: Vickki Hearing, MD;  Location: [facility];  Service: Orthopedics;  Laterality: Left;        Home Medications    Prior to Admission medications   Medication Sig Start Date End Date Taking? Authorizing Provider  acetaminophen (TYLENOL) 500 MG tablet Take 500 mg by mouth 2 (two) times daily as needed.    [provider]  amphetamine-dextroamphetamine (ADDERALL) 10 MG tablet Take 10 mg by mouth daily with breakfast.    [provider]  amphetamine-dextroamphetamine (ADDERALL) 10 MG tablet Take 1 tablet (10 mg total) by mouth daily with breakfast. 07/29/18   Donnetta Hutching, MD  diazepam (VALIUM) 5 MG tablet Take 1 tablet (5 mg total) by mouth every 6 (six) hours as needed for anxiety. 10/30/16   Vickki Hearing, MD  escitalopram (LEXAPRO)  20 MG tablet Take 20 mg by mouth daily.    [provider]  escitalopram (LEXAPRO) 20 MG tablet Take 1 tablet (20 mg total) by mouth daily. 07/29/18   Donnetta Hutching, MD  etodolac (LODINE) 500 MG tablet Take 1 tablet (500 mg total) by mouth 2 (two) times daily. 09/04/16   Vickki Hearing, MD  HYDROcodone-acetaminophen (NORCO) 5-325 MG tablet Take 1 tablet by mouth every 4 (four) hours as needed for moderate pain. 10/30/16   Vickki Hearing, MD  HYDROcodone-acetaminophen (NORCO/VICODIN) 5-325 MG tablet Take 1 tablet by mouth every 4 (four) hours as needed. 07/29/18   Donnetta Hutching, MD  levETIRAcetam (KEPPRA) 500 MG tablet Take 1 tablet (500 mg total) by mouth 2 (two) times daily. 10/28/15   Ward, Layla Maw, DO  levETIRAcetam (KEPPRA) 500 MG tablet Take 1 tablet (500 mg total) by mouth 2 (two) times daily. 07/29/18   Donnetta Hutching, MD    Family History No family history on file.  Social History Social History   Tobacco Use  . Smoking status: Current Some Day Smoker    Packs/day: 1.00  . Smokeless tobacco: Current User  Substance Use Topics  . Alcohol use: Yes    Alcohol/week: 0.0 standard drinks    Comment: occ  . Drug use: Not Currently     Allergies   Patient has no known allergies.   Review of Systems Review of Systems  All other systems reviewed and are negative.    Physical Exam Updated Vital Signs BP (!) 146/78 (BP Location: Left Arm)   Pulse 75   Temp 97.9 F (36.6 C) (Oral)   Resp 18   SpO2 100%   Physical Exam  Constitutional: He is oriented to person, place, and time. He appears well-developed and well-nourished.  HENT:  Head: Normocephalic and atraumatic.  Eyes: Conjunctivae are normal.  Neck: Neck supple.  Cardiovascular: Normal rate and regular rhythm.  Pulmonary/Chest: Effort normal and breath sounds normal.  Abdominal: Soft. Bowel sounds are normal.  Musculoskeletal: Normal range of motion.  Neurological: He is alert and oriented to person,  place, and time.  Skin: Skin is warm and dry.  Psychiatric: He has a normal mood and affect. His behavior is normal.  Nursing note and vitals reviewed.    ED Treatments / Results  Labs (all labs ordered are listed, but only abnormal results are displayed) Labs Reviewed - No data to display  EKG None  Radiology No results found.  Procedures Procedures (including critical care time)  Medications Ordered in ED Medications  amphetamine-dextroamphetamine (ADDERALL) tablet 10 mg (10 mg Oral Given 07/29/18 0824)  levETIRAcetam (KEPPRA) IVPB 500 mg/100 mL premix (0 mg Intravenous Stopped 07/29/18 0855)  HYDROcodone-acetaminophen (NORCO/VICODIN) 5-325 MG per tablet 1 tablet (1 tablet Oral Given 07/29/18 0824)  escitalopram (LEXAPRO) tablet 20 mg (20 mg Oral Given 07/29/18 1610)     Initial Impression / Assessment and Plan / ED Course  I have reviewed the triage vital signs and the nursing notes.  Pertinent labs & imaging results that were available during my care of the patient were reviewed by me and considered in my medical decision making (see chart for details).     Patient presents with a need for refills of his medication.  He is slightly anxious but otherwise normal.  I will refill his meds for 1 week including Keppra 500 mg, Lexapro 20 mg, Adderall 10 mg, Norco [ #10].  Encourage patient to follow-up with his primary care doctor.  Final Clinical Impressions(s) / ED Diagnoses   Final diagnoses:  Normal exam    ED Discharge Orders         Ordered    levETIRAcetam (KEPPRA) 500 MG tablet  2 times daily     07/29/18 0912    HYDROcodone-acetaminophen (NORCO/VICODIN) 5-325 MG tablet  Every 4 hours PRN     07/29/18 0912    escitalopram (LEXAPRO) 20 MG tablet  Daily     07/29/18 0912    amphetamine-dextroamphetamine (ADDERALL) 10 MG tablet  Daily with breakfast     07/29/18 0912           Donnetta Hutching, MD 07/29/18 [phone]

## 2018-07-29 NOTE — ED Notes (Signed)
Pt took tylenol, threw medication cup on the floor in room.  Pt upset that he didn't get narcotics or xanax for his anxiety.  Pt has been constantly asking about staying the night in the room.  Pt very agitated.  Cussing at staff, stating "fuck you" and that staff "are assholes"

## 2018-07-29 NOTE — ED Notes (Signed)
Pt refused to sign for d/c instructions due to him not getting the medications he wanted.  Security escorted pt out of the dept.

## 2018-07-29 NOTE — Discharge Instructions (Addendum)
Refill for your basic medications for one week.  Otherwise, follow-up with your primary care doctor

## 2018-07-29 NOTE — Discharge Instructions (Signed)
Please follow up with the clinics listed.   Thank you for allowing [place] to participate in your care today.

## 2023-01-29 ENCOUNTER — Ambulatory Visit: Payer: Self-pay | Admitting: Internal Medicine

## 2023-02-06 ENCOUNTER — Ambulatory Visit: Payer: Medicaid Other | Admitting: Internal Medicine

## 2023-06-12 ENCOUNTER — Ambulatory Visit (INDEPENDENT_AMBULATORY_CARE_PROVIDER_SITE_OTHER): Payer: MEDICAID | Admitting: Internal Medicine

## 2023-06-12 ENCOUNTER — Encounter: Payer: Self-pay | Admitting: Internal Medicine

## 2023-06-12 VITALS — BP 127/89 | HR 80 | Ht 72.0 in | Wt 253.6 lb

## 2023-06-12 DIAGNOSIS — F411 Generalized anxiety disorder: Secondary | ICD-10-CM

## 2023-06-12 DIAGNOSIS — F988 Other specified behavioral and emotional disorders with onset usually occurring in childhood and adolescence: Secondary | ICD-10-CM | POA: Insufficient documentation

## 2023-06-12 DIAGNOSIS — F209 Schizophrenia, unspecified: Secondary | ICD-10-CM

## 2023-06-12 DIAGNOSIS — Z114 Encounter for screening for human immunodeficiency virus [HIV]: Secondary | ICD-10-CM

## 2023-06-12 DIAGNOSIS — Z0001 Encounter for general adult medical examination with abnormal findings: Secondary | ICD-10-CM | POA: Diagnosis not present

## 2023-06-12 DIAGNOSIS — Z1159 Encounter for screening for other viral diseases: Secondary | ICD-10-CM

## 2023-06-12 DIAGNOSIS — Z2821 Immunization not carried out because of patient refusal: Secondary | ICD-10-CM | POA: Diagnosis not present

## 2023-06-12 NOTE — Patient Instructions (Signed)
Please continue to take medications as prescribed.  Please continue to follow low carb diet and perform moderate exercise/walking at least 150 mins/week.  You are being referred to [facility] in [place].

## 2023-06-12 NOTE — Assessment & Plan Note (Signed)
Well-controlled with Xanax 1 mg 3 times daily, Lexapro 20 mg daily and Remeron 30 mg nightly

## 2023-06-12 NOTE — Assessment & Plan Note (Signed)
On Adderall 20 mg 3 times daily

## 2023-06-12 NOTE — Assessment & Plan Note (Signed)
Physical exam as documented. Fasting blood tests today. Denied flu vaccine.

## 2023-06-12 NOTE — Progress Notes (Signed)
New Patient Office Visit  Subjective:  Patient ID: George [name], male    DOB: 09/05/1991  Age: 32 y.o. MRN: [id number]  CC:  Chief Complaint  Patient presents with   Establish Care    HPI George [name] is a 32 y.o. male with past medical history schizophrenia, GAD, ADD and alcohol abuse in remission who presents for establishing care. Has not seen PCP for at least 5 years.  Schizophrenia: He is seeing Dr Evelene Croon for it, but she is out of network now and he needs a new Psychiatry referral.  He is on Abilify 30 mg nightly, Depakote 250 mg TID, Remeron 30 mg nightly and Lexapro 20 mg daily.  He is pleasant and cooperative.  Denies any recent episode of agitation, delusion or hallucinations.  GAD: He takes Xanax 1 mg 3 times daily currently.  Denies any recent manic episode.  ADD: He takes Adderall 20 mg 3 times daily.  He has quit alcohol use from 2016. Had alcohol withdrawal seizure due to sudden abruption of alcohol intake.  He was taking Keppra, but has been seizure-free since 2016 without Keppra now.    Past Medical History:  Diagnosis Date   ADHD (attention deficit hyperactivity disorder)    Depressed    Dislocation of left shoulder joint    Fracture of humeral head, left, closed     Past Surgical History:  Procedure Laterality Date   ORIF HUMERUS FRACTURE Left 06/03/2015   Procedure: OPEN REDUCTION INTERNAL FIXATION LEFT PROXIMAL HUMERUS FRACTURE;  Surgeon: Vickki Hearing, MD;  Location: [facility];  Service: Orthopedics;  Laterality: Left;    History reviewed. No pertinent family history.  Social History   Socioeconomic History   Marital status: Single    Spouse name: Not on file   Number of children: Not on file   Years of education: Not on file   Highest education level: Not on file  Occupational History   Not on file  Tobacco Use   Smoking status: Some Days    Current packs/day: 1.00    Types: Cigarettes   Smokeless tobacco: Current  Substance  and Sexual Activity   Alcohol use: Yes    Alcohol/week: 0.0 standard drinks of alcohol    Comment: occ   Drug use: Not Currently   Sexual activity: Not on file  Other Topics Concern   Not on file  Social History Narrative   Not on file   Social Determinants of Health   Financial Resource Strain: Not on file  Food Insecurity: Not on file  Transportation Needs: Not on file  Physical Activity: Not on file  Stress: Not on file  Social Connections: Not on file  Intimate Partner Violence: Not on file    ROS Review of Systems  Constitutional:  Negative for chills and fever.  HENT:  Negative for congestion and sore throat.   Eyes:  Negative for pain and discharge.  Respiratory:  Negative for cough and shortness of breath.   Cardiovascular:  Negative for chest pain and palpitations.  Gastrointestinal:  Negative for constipation, diarrhea, nausea and vomiting.  Endocrine: Negative for polydipsia and polyuria.  Genitourinary:  Negative for dysuria and hematuria.  Musculoskeletal:  Negative for neck pain and neck stiffness.  Skin:  Negative for rash.  Neurological:  Negative for dizziness, weakness, numbness and headaches.  Psychiatric/Behavioral:  Negative for agitation and behavioral problems. The patient is nervous/anxious.     Objective:   Today's Vitals: BP 127/89  Pulse 80   Ht 6' (1.829 m)   Wt 253 lb 9.6 oz (115 kg)   SpO2 98%   BMI 34.39 kg/m   Physical Exam Vitals reviewed.  Constitutional:      General: He is not in acute distress.    Appearance: He is obese. He is not diaphoretic.  HENT:     Head: Normocephalic and atraumatic.     Nose: Nose normal.     Mouth/Throat:     Mouth: Mucous membranes are moist.  Eyes:     General: No scleral icterus.    Extraocular Movements: Extraocular movements intact.  Cardiovascular:     Rate and Rhythm: Normal rate and regular rhythm.     Heart sounds: Normal heart sounds. No murmur heard. Pulmonary:     Breath  sounds: Normal breath sounds. No wheezing or rales.  Abdominal:     Palpations: Abdomen is soft.     Tenderness: There is no abdominal tenderness.  Musculoskeletal:     Cervical back: Neck supple. No tenderness.     Right lower leg: No edema.     Left lower leg: No edema.  Skin:    General: Skin is warm.     Findings: No rash.  Neurological:     General: No focal deficit present.     Mental Status: He is alert and oriented to person, place, and time.     Sensory: No sensory deficit.     Motor: No weakness.  Psychiatric:        Mood and Affect: Mood normal.        Behavior: Behavior normal.     Assessment & Plan:   Problem List Items Addressed This Visit       Other   Schizophrenia (HCC)    Followed by psychiatry - Dr Evelene Croon, but she is out of his insurance network now Referred to [facility] in [place] On Abilify, Depakote, Lexapro and Remeron      Relevant Orders   Ambulatory referral to Psychiatry   CBC with Differential/Platelet   CMP14+EGFR   Attention deficit disorder    On Adderall 20 mg 3 times daily      Encounter for general adult medical examination with abnormal findings - Primary    Physical exam as documented. Fasting blood tests today. Denied flu vaccine.      GAD (generalized anxiety disorder)    Well-controlled with Xanax 1 mg 3 times daily, Lexapro 20 mg daily and Remeron 30 mg nightly      Relevant Medications   mirtazapine (REMERON) 30 MG tablet   ALPRAZolam (XANAX) 1 MG tablet   Other Visit Diagnoses     Refused influenza vaccine       Screening for HIV (human immunodeficiency virus)       Relevant Orders   HIV antibody (with reflex)   Need for hepatitis C screening test       Relevant Orders   Hepatitis C Antibody       Outpatient Encounter Medications as of 06/12/2023  Medication Sig   ALPRAZolam (XANAX) 1 MG tablet Take 1 mg by mouth 3 (three) times daily.   amantadine (SYMMETREL) 100 MG capsule Take 100 mg by mouth 2 (two)  times daily.   amphetamine-dextroamphetamine (ADDERALL) 20 MG tablet Take 20 mg by mouth in the morning, at noon, and at bedtime.   ARIPiprazole (ABILIFY) 30 MG tablet Take 30 mg by mouth at bedtime.   divalproex (DEPAKOTE ER) 250 MG 24  hr tablet Take 250 mg by mouth 3 (three) times daily.   acetaminophen (TYLENOL) 500 MG tablet Take 500 mg by mouth 2 (two) times daily as needed.   escitalopram (LEXAPRO) 20 MG tablet Take 20 mg by mouth daily.   etodolac (LODINE) 500 MG tablet Take 1 tablet (500 mg total) by mouth 2 (two) times daily.   mirtazapine (REMERON) 30 MG tablet Take 30 mg by mouth at bedtime.   naltrexone (DEPADE) 50 MG tablet Take 50 mg by mouth daily.   propranolol (INDERAL) 20 MG tablet Take 20 mg by mouth 3 (three) times daily.   [DISCONTINUED] amphetamine-dextroamphetamine (ADDERALL) 10 MG tablet Take 10 mg by mouth daily with breakfast.   [DISCONTINUED] amphetamine-dextroamphetamine (ADDERALL) 10 MG tablet Take 1 tablet (10 mg total) by mouth daily with breakfast.   [DISCONTINUED] diazepam (VALIUM) 5 MG tablet Take 1 tablet (5 mg total) by mouth every 6 (six) hours as needed for anxiety.   [DISCONTINUED] escitalopram (LEXAPRO) 20 MG tablet Take 1 tablet (20 mg total) by mouth daily.   [DISCONTINUED] HYDROcodone-acetaminophen (NORCO) 5-325 MG tablet Take 1 tablet by mouth every 4 (four) hours as needed for moderate pain.   [DISCONTINUED] HYDROcodone-acetaminophen (NORCO/VICODIN) 5-325 MG tablet Take 1 tablet by mouth every 4 (four) hours as needed.   [DISCONTINUED] levETIRAcetam (KEPPRA) 500 MG tablet Take 1 tablet (500 mg total) by mouth 2 (two) times daily.   [DISCONTINUED] levETIRAcetam (KEPPRA) 500 MG tablet Take 1 tablet (500 mg total) by mouth 2 (two) times daily.   No facility-administered encounter medications on file as of 06/12/2023.    Follow-up: Return in about 6 months (around 12/13/2023).   Anabel Halon, MD

## 2023-06-12 NOTE — Assessment & Plan Note (Signed)
Followed by psychiatry - Dr Evelene Croon, but she is out of his insurance network now Referred to [facility] in [place] On Abilify, Depakote, Lexapro and Remeron

## 2023-06-13 ENCOUNTER — Telehealth: Payer: Self-pay

## 2023-06-13 LAB — CBC WITH DIFFERENTIAL/PLATELET
Basophils Absolute: 0.1 10*3/uL (ref 0.0–0.2)
Basos: 1 %
EOS (ABSOLUTE): 0.1 10*3/uL (ref 0.0–0.4)
Eos: 1 %
Hematocrit: 47.1 % (ref 37.5–51.0)
Hemoglobin: 15.8 g/dL (ref 13.0–17.7)
Immature Grans (Abs): 0 10*3/uL (ref 0.0–0.1)
Immature Granulocytes: 0 %
Lymphocytes Absolute: 3.3 10*3/uL — ABNORMAL HIGH (ref 0.7–3.1)
Lymphs: 35 %
MCH: 30.3 pg (ref 26.6–33.0)
MCHC: 33.5 g/dL (ref 31.5–35.7)
MCV: 90 fL (ref 79–97)
Monocytes Absolute: 0.5 10*3/uL (ref 0.1–0.9)
Monocytes: 5 %
Neutrophils Absolute: 5.5 10*3/uL (ref 1.4–7.0)
Neutrophils: 58 %
Platelets: 338 10*3/uL (ref 150–450)
RBC: 5.22 x10E6/uL (ref 4.14–5.80)
RDW: 12.5 % (ref 11.6–15.4)
WBC: 9.5 10*3/uL (ref 3.4–10.8)

## 2023-06-13 LAB — CMP14+EGFR
ALT: 30 IU/L (ref 0–44)
AST: 22 IU/L (ref 0–40)
Albumin: 4.9 g/dL (ref 4.1–5.1)
Alkaline Phosphatase: 94 IU/L (ref 44–121)
BUN/Creatinine Ratio: 7 — ABNORMAL LOW (ref 9–20)
BUN: 7 mg/dL (ref 6–20)
Bilirubin Total: 0.4 mg/dL (ref 0.0–1.2)
CO2: 20 mmol/L (ref 20–29)
Calcium: 10.5 mg/dL — ABNORMAL HIGH (ref 8.7–10.2)
Chloride: 94 mmol/L — ABNORMAL LOW (ref 96–106)
Creatinine, Ser: 1.03 mg/dL (ref 0.76–1.27)
Globulin, Total: 2.8 g/dL (ref 1.5–4.5)
Glucose: 103 mg/dL — ABNORMAL HIGH (ref 70–99)
Potassium: 5 mmol/L (ref 3.5–5.2)
Sodium: 134 mmol/L (ref 134–144)
Total Protein: 7.7 g/dL (ref 6.0–8.5)
eGFR: 99 mL/min/{1.73_m2} (ref >59)

## 2023-06-13 LAB — HIV ANTIBODY (ROUTINE TESTING W REFLEX): HIV Screen 4th Generation wRfx: NONREACTIVE

## 2023-06-13 LAB — HEPATITIS C ANTIBODY: Hep C Virus Ab: NONREACTIVE

## 2023-06-13 NOTE — Telephone Encounter (Signed)
I called and spoke to George [name], I told him that all his blood test were in normal limits. He understood and had no other questions or concerns.

## 2023-06-13 NOTE — Telephone Encounter (Signed)
-----   Message from George [name] sent at 06/13/2023  8:01 AM EDT ----- Please advise the patient that the blood tests are overall within normal limits.

## 2023-12-12 ENCOUNTER — Ambulatory Visit: Payer: MEDICAID | Admitting: Internal Medicine

## 2024-01-14 ENCOUNTER — Ambulatory Visit (INDEPENDENT_AMBULATORY_CARE_PROVIDER_SITE_OTHER): Payer: MEDICAID | Admitting: Family Medicine

## 2024-01-14 ENCOUNTER — Ambulatory Visit: Payer: Self-pay

## 2024-01-14 ENCOUNTER — Encounter: Payer: Self-pay | Admitting: Family Medicine

## 2024-01-14 VITALS — BP 129/86 | HR 88 | Temp 98.2°F | Ht 72.0 in | Wt 245.0 lb

## 2024-01-14 DIAGNOSIS — Z1331 Encounter for screening for depression: Secondary | ICD-10-CM

## 2024-01-14 DIAGNOSIS — L02612 Cutaneous abscess of left foot: Secondary | ICD-10-CM

## 2024-01-14 DIAGNOSIS — F209 Schizophrenia, unspecified: Secondary | ICD-10-CM | POA: Diagnosis not present

## 2024-01-14 DIAGNOSIS — F411 Generalized anxiety disorder: Secondary | ICD-10-CM | POA: Diagnosis not present

## 2024-01-14 MED ORDER — SULFAMETHOXAZOLE-TRIMETHOPRIM 800-160 MG PO TABS: 1.0000 | ORAL_TABLET | Freq: Two times a day (BID) | ORAL | 0 refills | Status: AC

## 2024-01-14 NOTE — Progress Notes (Signed)
 Subjective:  Patient ID: George [name], male    DOB: [birth year], 33 y.o.   MRN: [id number]  Patient Care Team: Anabel Halon, MD as PCP - General (Internal Medicine)   Chief Complaint:  No chief complaint on file.  HPI: George [name] is a 33 y.o. male presenting on 01/14/2024 for No chief complaint on file. States that he has "bump" on the bottom of his foot. Started 3 days ago. States that it is painful to walk on it. States that the first day he did not notice any swelling, only pain, then it started swelling rapidly. States that his water went out and he is not able to soak it. Not currently draining. Denies fever, nausea, vomiting.   Relevant past medical, surgical, family, and social history reviewed and updated as indicated.  Allergies and medications reviewed and updated. Data reviewed: Chart in Epic.   Past Medical History:  Diagnosis Date   ADHD (attention deficit hyperactivity disorder)    Depressed    Dislocation of left shoulder joint    Fracture of humeral head, left, closed     Past Surgical History:  Procedure Laterality Date   ORIF HUMERUS FRACTURE Left 06/03/2015   Procedure: OPEN REDUCTION INTERNAL FIXATION LEFT PROXIMAL HUMERUS FRACTURE;  Surgeon: Vickki Hearing, MD;  Location: [facility];  Service: Orthopedics;  Laterality: Left;    Social History   Socioeconomic History   Marital status: Single    Spouse name: Not on file   Number of children: Not on file   Years of education: Not on file   Highest education level: Not on file  Occupational History   Not on file  Tobacco Use   Smoking status: Some Days    Current packs/day: 1.00    Types: Cigarettes   Smokeless tobacco: Current  Substance and Sexual Activity   Alcohol use: Yes    Alcohol/week: 0.0 standard drinks of alcohol    Comment: occ   Drug use: Not Currently   Sexual activity: Not on file  Other Topics Concern   Not on file  Social History Narrative   Not on file    Social Drivers of Health   Financial Resource Strain: Not on file  Food Insecurity: Not on file  Transportation Needs: Not on file  Physical Activity: Not on file  Stress: Not on file  Social Connections: Not on file  Intimate Partner Violence: Not on file    Outpatient Encounter Medications as of 01/14/2024  Medication Sig   acetaminophen (TYLENOL) 500 MG tablet Take 500 mg by mouth 2 (two) times daily as needed.   ALPRAZolam (XANAX) 1 MG tablet Take 1 mg by mouth 3 (three) times daily.   amantadine (SYMMETREL) 100 MG capsule Take 100 mg by mouth 2 (two) times daily.   amphetamine-dextroamphetamine (ADDERALL) 20 MG tablet Take 20 mg by mouth in the morning, at noon, and at bedtime.   ARIPiprazole (ABILIFY) 30 MG tablet Take 30 mg by mouth at bedtime.   divalproex (DEPAKOTE ER) 250 MG 24 hr tablet Take 250 mg by mouth 3 (three) times daily.   escitalopram (LEXAPRO) 20 MG tablet Take 20 mg by mouth daily.   etodolac (LODINE) 500 MG tablet Take 1 tablet (500 mg total) by mouth 2 (two) times daily.   mirtazapine (REMERON) 30 MG tablet Take 30 mg by mouth at bedtime.   naltrexone (DEPADE) 50 MG tablet Take 50 mg by mouth daily.   propranolol (INDERAL) 20  MG tablet Take 20 mg by mouth 3 (three) times daily.   No facility-administered encounter medications on file as of 01/14/2024.    No Known Allergies  Review of Systems As per HPI  Objective:  BP 129/86   Pulse 88   Temp 98.2 F (36.8 C)   Ht 6' (1.829 m)   Wt 245 lb (111.1 kg)   SpO2 95%   BMI 33.23 kg/m    Wt Readings from Last 3 Encounters:  06/12/23 253 lb 9.6 oz (115 kg)  07/29/18 180 lb (81.6 kg)  12/28/16 209 lb (94.8 kg)    Physical Exam Constitutional:      General: He is awake. He is not in acute distress.    Appearance: Normal appearance. He is well-developed and well-groomed. He is not ill-appearing, toxic-appearing or diaphoretic.  Cardiovascular:     Rate and Rhythm: Normal rate and regular rhythm.      Pulses: Normal pulses.          Radial pulses are 2+ on the right side and 2+ on the left side.       Posterior tibial pulses are 2+ on the right side and 2+ on the left side.     Heart sounds: Normal heart sounds. No murmur heard.    No gallop.  Pulmonary:     Effort: Pulmonary effort is normal. No respiratory distress.     Breath sounds: Normal breath sounds. No stridor. No wheezing, rhonchi or rales.  Musculoskeletal:     Cervical back: Full passive range of motion without pain and neck supple.     Right lower leg: No edema.     Left lower leg: No edema.       Feet:  Feet:     Comments: Soft, elevated, abscess  Skin:    General: Skin is warm.     Capillary Refill: Capillary refill takes less than 2 seconds.  Neurological:     General: No focal deficit present.     Mental Status: He is alert, oriented to person, place, and time and easily aroused. Mental status is at baseline.     GCS: GCS eye subscore is 4. GCS verbal subscore is 5. GCS motor subscore is 6.     Motor: No weakness.  Psychiatric:        Attention and Perception: Attention and perception normal.        Mood and Affect: Mood and affect normal.        Speech: Speech normal.        Behavior: Behavior normal. Behavior is cooperative.        Thought Content: Thought content normal. Thought content does not include homicidal or suicidal ideation. Thought content does not include homicidal or suicidal plan.        Cognition and Memory: Cognition and memory normal.        Judgment: Judgment normal.        Results for orders placed or performed in visit on 06/12/23  CBC with Differential/Platelet   Collection Time: 06/12/23  3:55 PM  Result Value Ref Range   WBC 9.5 3.4 - 10.8 x10E3/uL   RBC 5.22 4.14 - 5.80 x10E6/uL   Hemoglobin 15.8 13.0 - 17.7 g/dL   Hematocrit 14.7 82.9 - 51.0 %   MCV 90 79 - 97 fL   MCH 30.3 26.6 - 33.0 pg   MCHC 33.5 31.5 - 35.7 g/dL   RDW 56.2 13.0 - 86.5 %   Platelets 338 150 -  450  x10E3/uL   Neutrophils 58 Not Estab. %   Lymphs 35 Not Estab. %   Monocytes 5 Not Estab. %   Eos 1 Not Estab. %   Basos 1 Not Estab. %   Neutrophils Absolute 5.5 1.4 - 7.0 x10E3/uL   Lymphocytes Absolute 3.3 (H) 0.7 - 3.1 x10E3/uL   Monocytes Absolute 0.5 0.1 - 0.9 x10E3/uL   EOS (ABSOLUTE) 0.1 0.0 - 0.4 x10E3/uL   Basophils Absolute 0.1 0.0 - 0.2 x10E3/uL   Immature Granulocytes 0 Not Estab. %   Immature Grans (Abs) 0.0 0.0 - 0.1 x10E3/uL  CMP14+EGFR   Collection Time: 06/12/23  3:55 PM  Result Value Ref Range   Glucose 103 (H) 70 - 99 mg/dL   BUN 7 6 - 20 mg/dL   Creatinine, Ser 5.62 0.76 - 1.27 mg/dL   eGFR 99 >13 YQ/MVH/8.46   BUN/Creatinine Ratio 7 (L) 9 - 20   Sodium 134 134 - 144 mmol/L   Potassium 5.0 3.5 - 5.2 mmol/L   Chloride 94 (L) 96 - 106 mmol/L   CO2 20 20 - 29 mmol/L   Calcium 10.5 (H) 8.7 - 10.2 mg/dL   Total Protein 7.7 6.0 - 8.5 g/dL   Albumin 4.9 4.1 - 5.1 g/dL   Globulin, Total 2.8 1.5 - 4.5 g/dL   Bilirubin Total 0.4 0.0 - 1.2 mg/dL   Alkaline Phosphatase 94 44 - 121 IU/L   AST 22 0 - 40 IU/L   ALT 30 0 - 44 IU/L  HIV antibody (with reflex)   Collection Time: 06/12/23  3:55 PM  Result Value Ref Range   HIV Screen 4th Generation wRfx Non Reactive Non Reactive  Hepatitis C Antibody   Collection Time: 06/12/23  3:55 PM  Result Value Ref Range   Hep C Virus Ab Non Reactive Non Reactive       06/12/2023    3:39 PM  Depression screen PHQ 2/9  Decreased Interest 1  Down, Depressed, Hopeless 2  PHQ - 2 Score 3  Altered sleeping 3  Tired, decreased energy 3  Change in appetite 3  Feeling bad or failure about yourself  2  Trouble concentrating 3  Moving slowly or fidgety/restless 0  Suicidal thoughts 2  PHQ-9 Score 19       06/12/2023    3:39 PM  GAD 7 : Generalized Anxiety Score  Nervous, Anxious, on Edge 3  Control/stop worrying 3  Worry too much - different things 3  Trouble relaxing 3  Restless 3  Easily annoyed or irritable 1   Afraid - awful might happen 2  Total GAD 7 Score 18    I & D  Date/Time: 01/14/2024 9:10 PM  Performed by: Arrie Senate, FNP Authorized by: Arrie Senate, FNP   Consent:    Consent obtained:  Written   Consent given by:  Patient   Risks, benefits, and alternatives were discussed: yes     Risks discussed:  Bleeding, incomplete drainage, pain and infection   Alternatives discussed:  Alternative treatment Location:    Type:  Abscess   Location:  Lower extremity   Lower extremity location:  Foot   Foot location:  L foot Pre-procedure details:    Skin preparation:  Alcohol and povidone-iodine Sedation:    Sedation type:  None Procedure type:    Complexity:  Simple Procedure details:    Needle aspiration: no     Incision types:  Single straight   Incision depth:  Submucosal  Scalpel blade:  10   Drainage:  Bloody, purulent and serosanguinous   Drainage amount:  Copious   Wound treatment:  Wound left open   Packing materials:  None Post-procedure details:    Procedure completion:  Tolerated well, no immediate complications    Pertinent labs & imaging results that were available during my care of the patient were reviewed by me and considered in my medical decision making.  Assessment & Plan:  Diagnoses and all orders for this visit:  1. Abscess of left foot (Primary) Procedure performed as above and tolerated well. Culture collected. Will determine next steps once resulted. Antibiotic placed as below. Discussed at home care.  - sulfamethoxazole-trimethoprim (BACTRIM DS) 800-160 MG tablet; Take 1 tablet by mouth 2 (two) times daily for 7 days.  Dispense: 14 tablet; Refill: 0 - WOUND CULTURE - I & D  2. Schizophrenia, unspecified type (HCC) Stable. Denies SI. Safety contract established. Patient plans to follow up with PCP.   3. GAD (generalized anxiety disorder) As above.   4. Encounter for screening for depression As above.    Continue all  other maintenance medications.  Follow up plan: Return in about 1 week (around 01/21/2024) for with PCP .   Continue healthy lifestyle choices, including diet (rich in fruits, vegetables, and lean proteins, and low in salt and simple carbohydrates) and exercise (at least 30 minutes of moderate physical activity daily).  Written and verbal instructions provided   The above assessment and management plan was discussed with the patient. The patient verbalized understanding of and has agreed to the management plan. Patient is aware to call the clinic if they develop any new symptoms or if symptoms persist or worsen. Patient is aware when to return to the clinic for a follow-up visit. Patient educated on when it is appropriate to go to the emergency department.   George Burly, DNP-FNP [facility] [address] [place], [place] [address] [phone]

## 2024-01-14 NOTE — Telephone Encounter (Signed)
 Copied from CRM [id number]. Topic: Clinical - Red Word Triage >> Jan 14, 2024  9:09 AM Izetta Dakin wrote: [facility] that prompted transfer to Nurse Triage: Foreign object, swelling-Lt foot  Chief Complaint: Foot swelling Symptoms: Pain, redness Frequency: 3 days ago Pertinent Negatives: Patient denies fever Disposition: [] ED /[] Urgent Care (no appt availability in office) / [x] Appointment(In office/virtual)/ []  [facility]/ [] Home Care/ [] Refused Recommended Disposition /[] [facility]/ []  Follow-up with PCP Additional Notes: Spoke to patient's mother on behalf of the patient who has been experiencing pain and swelling in his left foot for the last 3 days. Mother stated she thinks her son stepped on something. Mother denied being able to see a break in the skin or object. Mother stated the bottom of the foot is swollen and red. Mother stated the bottom of the foot has a "blister-like" appearance. Mother denied fever and discharge. Mother stated the patient is still able to walk, but it is painful and he has to walk on the side of his foot. This RN advised patient to see a provider today, per protocol. No availability with PCP office. This RN scheduled patient with provider in alterate office for this afternoon. This RN provided care advised and instructed mother to call back if symptoms worsen. Patient/mother complied.   Reason for Disposition  [1] Redness of the skin AND [2] no fever  Answer Assessment - Initial Assessment Questions 1. MECHANISM: "How did the injury happen?" (e.g., twisting injury, direct blow)      Unknown, mother states she thinks he stepped something 2. ONSET: "When did the injury happen?" (Minutes or hours ago)      Started 3 days ago, worsened yesterday 3. LOCATION: "Where is the injury located?"      Bottom of left foot 4. APPEARANCE of INJURY: "What does the injury look like?"      Swelling, redness, states swelling looks like a big blister 5.  WEIGHT-BEARING: "Can you put weight on that foot?" "Can you walk (four steps or more)?"       States he can hardly put weight on the foot 6. SIZE: For cuts, bruises, or swelling, ask: "How large is it?" (e.g., inches or centimeters;  entire joint)      States left foot has doubled in size 7. PAIN: "Is there pain?" If Yes, ask: "How bad is the pain?"    (e.g., Scale 1-10; or mild, moderate, severe)   - NONE (0): no pain.   - MILD (1-3): doesn't interfere with normal activities.    - MODERATE (4-7): interferes with normal activities (e.g., work or school) or awakens from sleep, limping.    - SEVERE (8-10): excruciating pain, unable to do any normal activities, unable to walk.      States he has to walk on the side of the foot 8. TETANUS: For any breaks in the skin, ask: "When was the last tetanus booster?"     Unsure of breaks in skin 9. OTHER SYMPTOMS: "Do you have any other symptoms?"      Denies drainage, denies bleeding, denies fever  Protocols used: Ankle and Foot Injury-A-AH, Foot Pain-A-AH

## 2024-01-15 ENCOUNTER — Telehealth: Payer: Self-pay | Admitting: Family Medicine

## 2024-01-15 NOTE — Telephone Encounter (Signed)
Lmtcb to schedule apt  ?

## 2024-01-15 NOTE — Telephone Encounter (Signed)
 Please have patient come in to update tetanus given recent infection.

## 2024-01-16 NOTE — Progress Notes (Signed)
 Staph confirmed. Continue abx. Follow up if wound does not improve. Please complete TDAP.

## 2024-01-17 LAB — WOUND CULTURE: Organism ID, Bacteria: NONE SEEN

## 2024-01-17 NOTE — Progress Notes (Signed)
 MRSA on culture. Continue bactrim. Follow up with PCP. Please complete TDAP

## 2024-01-20 ENCOUNTER — Encounter: Payer: Self-pay | Admitting: Internal Medicine

## 2024-01-20 ENCOUNTER — Ambulatory Visit: Payer: MEDICAID | Admitting: Internal Medicine

## 2024-01-20 VITALS — BP 138/84 | HR 96 | Ht 72.0 in | Wt 246.2 lb

## 2024-01-20 DIAGNOSIS — L02612 Cutaneous abscess of left foot: Secondary | ICD-10-CM

## 2024-01-20 DIAGNOSIS — Z23 Encounter for immunization: Secondary | ICD-10-CM

## 2024-01-20 DIAGNOSIS — F209 Schizophrenia, unspecified: Secondary | ICD-10-CM | POA: Diagnosis not present

## 2024-01-20 DIAGNOSIS — M7989 Other specified soft tissue disorders: Secondary | ICD-10-CM

## 2024-01-20 DIAGNOSIS — F988 Other specified behavioral and emotional disorders with onset usually occurring in childhood and adolescence: Secondary | ICD-10-CM

## 2024-01-20 DIAGNOSIS — F411 Generalized anxiety disorder: Secondary | ICD-10-CM

## 2024-01-20 MED ORDER — MUPIROCIN 2 % EX OINT: 1.0000 | TOPICAL_OINTMENT | Freq: Two times a day (BID) | CUTANEOUS | 0 refills | Status: AC

## 2024-01-20 NOTE — Assessment & Plan Note (Signed)
 S/p I&D on 01/14/24 Continue Bactrim - wound culture showed MRSA Wound healing well Mupirocin ointment for local wound care

## 2024-01-20 NOTE — Assessment & Plan Note (Signed)
 Overall well-controlled On Adderall 10 mg 3 times daily

## 2024-01-20 NOTE — Progress Notes (Signed)
 New Patient Office Visit  Subjective:  Patient ID: George [name], male    DOB: [birth year]  Age: 32 y.o. MRN: [id number]  CC:  Chief Complaint  Patient presents with   Care Management    Follow up    HPI George [name] is a 33 y.o. male with past medical history schizophrenia, GAD, ADD and alcohol abuse in remission who presents for f/u of his chronic medical conditions and recent foot abscess.  He had left foot abscess, status post I&D on 01/14/24. He has wrap bandage over the left foot and lower leg area.  Wound is healing well overall.  He is taking Bactrim for bacterial PPx.  Wound culture showed MRSA.  He is due for Tdap vaccine, which he received today.  Schizophrenia: He is seeing [facility] in [place] now.  He is on amantadine 100 mg twice daily, Depakote 250 mg TID, Remeron 30 mg nightly and Lexapro 20 mg daily.  He is pleasant and cooperative.  Denies any recent episode of agitation, delusion or hallucinations.  GAD: He takes Xanax 1 mg 3 times daily currently.  Denies any recent manic episode.  ADD: He takes Adderall 10 mg 3 times daily.  He has quit alcohol use from 2016. Had alcohol withdrawal seizure due to sudden abruption of alcohol intake.  He was taking Keppra, but has been seizure-free since 2016 without Keppra now.    Past Medical History:  Diagnosis Date   ADHD (attention deficit hyperactivity disorder)    Depressed    Dislocation of left shoulder joint    Fracture of humeral head, left, closed     Past Surgical History:  Procedure Laterality Date   ORIF HUMERUS FRACTURE Left 06/03/2015   Procedure: OPEN REDUCTION INTERNAL FIXATION LEFT PROXIMAL HUMERUS FRACTURE;  Surgeon: Vickki Hearing, MD;  Location: [facility];  Service: Orthopedics;  Laterality: Left;    History reviewed. No pertinent family history.  Social History   Socioeconomic History   Marital status: Single    Spouse name: Not on file   Number of children: Not on file    Years of education: Not on file   Highest education level: Not on file  Occupational History   Not on file  Tobacco Use   Smoking status: Some Days    Current packs/day: 1.00    Types: Cigarettes   Smokeless tobacco: Current  Substance and Sexual Activity   Alcohol use: Yes    Alcohol/week: 0.0 standard drinks of alcohol    Comment: occ   Drug use: Not Currently   Sexual activity: Not on file  Other Topics Concern   Not on file  Social History Narrative   Not on file   Social Drivers of Health   Financial Resource Strain: Not on file  Food Insecurity: Not on file  Transportation Needs: Not on file  Physical Activity: Not on file  Stress: Not on file  Social Connections: Not on file  Intimate Partner Violence: Not on file    ROS Review of Systems  Constitutional:  Negative for chills and fever.  HENT:  Negative for congestion and sore throat.   Eyes:  Negative for pain and discharge.  Respiratory:  Negative for cough and shortness of breath.   Cardiovascular:  Negative for chest pain and palpitations.  Gastrointestinal:  Negative for constipation, diarrhea, nausea and vomiting.  Endocrine: Negative for polydipsia and polyuria.  Genitourinary:  Negative for dysuria and hematuria.  Musculoskeletal:  Negative for neck  pain and neck stiffness.  Skin:  Positive for wound (Left foot). Negative for rash.  Neurological:  Negative for dizziness, weakness, numbness and headaches.  Psychiatric/Behavioral:  Negative for agitation and behavioral problems. The patient is nervous/anxious.     Objective:   Today's Vitals: BP 138/84 (BP Location: Left Arm)   Pulse 96   Ht 6' (1.829 m)   Wt 246 lb 3.2 oz (111.7 kg)   SpO2 95%   BMI 33.39 kg/m   Physical Exam Vitals reviewed.  Constitutional:      General: He is not in acute distress.    Appearance: He is obese. He is not diaphoretic.  HENT:     Head: Normocephalic and atraumatic.     Nose: Nose normal.      Mouth/Throat:     Mouth: Mucous membranes are moist.  Eyes:     General: No scleral icterus.    Extraocular Movements: Extraocular movements intact.  Cardiovascular:     Rate and Rhythm: Normal rate and regular rhythm.     Heart sounds: Normal heart sounds. No murmur heard. Pulmonary:     Breath sounds: Normal breath sounds. No wheezing or rales.  Musculoskeletal:     Cervical back: Neck supple. No tenderness.     Right lower leg: Edema (Mild) present.     Left lower leg: Edema (1+) present.  Skin:    General: Skin is warm.     Findings: Lesion (Left foot skin peeling about 4 cm X 3 cm, s/p I&D, wound appears well-healing) present. No rash.  Neurological:     General: No focal deficit present.     Mental Status: He is alert and oriented to person, place, and time.     Sensory: No sensory deficit.     Motor: No weakness.  Psychiatric:        Mood and Affect: Mood normal.        Behavior: Behavior normal.     Assessment & Plan:   Problem List Items Addressed This Visit       Other   Schizophrenia (HCC)   Overall well-controlled Followed by psychiatry -  [facility] in [place], used to see Dr Evelene Croon On amantadine, Depakote, Lexapro and Remeron      Attention deficit disorder   Overall well-controlled On Adderall 10 mg 3 times daily      GAD (generalized anxiety disorder)   Well-controlled with Xanax 1 mg 3 times daily, Lexapro 20 mg daily and Remeron 30 mg nightly      Abscess of left foot - Primary   S/p I&D on 01/14/24 Continue Bactrim - wound culture showed MRSA Wound healing well Mupirocin ointment for local wound care      Relevant Medications   mupirocin ointment (BACTROBAN) 2 %   Leg swelling   Likely due to prolonged standing Needs to perform leg elevation as tolerated Needs to remove wrap bandage from left foot at nighttime      Other Visit Diagnoses       Encounter for immunization       Relevant Orders   Tdap vaccine greater than or equal to  7yo IM (Completed)        Outpatient Encounter Medications as of 01/20/2024  Medication Sig   acetaminophen (TYLENOL) 500 MG tablet Take 500 mg by mouth 2 (two) times daily as needed.   ALPRAZolam (XANAX) 1 MG tablet Take 1 mg by mouth 3 (three) times daily.   amantadine (SYMMETREL) 100 MG capsule  Take 100 mg by mouth 2 (two) times daily.   amphetamine-dextroamphetamine (ADDERALL) 20 MG tablet Take 10 mg by mouth in the morning, at noon, and at bedtime.   ARIPiprazole (ABILIFY) 30 MG tablet Take 30 mg by mouth at bedtime.   divalproex (DEPAKOTE ER) 250 MG 24 hr tablet Take 250 mg by mouth 3 (three) times daily.   escitalopram (LEXAPRO) 20 MG tablet Take 20 mg by mouth daily.   etodolac (LODINE) 500 MG tablet Take 1 tablet (500 mg total) by mouth 2 (two) times daily.   mirtazapine (REMERON) 30 MG tablet Take 30 mg by mouth at bedtime.   mupirocin ointment (BACTROBAN) 2 % Apply 1 Application topically 2 (two) times daily.   naltrexone (DEPADE) 50 MG tablet Take 50 mg by mouth daily.   propranolol (INDERAL) 20 MG tablet Take 20 mg by mouth 3 (three) times daily.   sulfamethoxazole-trimethoprim (BACTRIM DS) 800-160 MG tablet Take 1 tablet by mouth 2 (two) times daily for 7 days.   No facility-administered encounter medications on file as of 01/20/2024.    Follow-up: Return in about 5 months (around 06/21/2024).   Anabel Halon, MD

## 2024-01-20 NOTE — Assessment & Plan Note (Signed)
Well-controlled with Xanax 1 mg 3 times daily, Lexapro 20 mg daily and Remeron 30 mg nightly

## 2024-01-20 NOTE — Patient Instructions (Signed)
 Please complete course of Bactrim as prescribed.  Please apply Mupirocin cream near the wound. Apply gauze piece and then apply wrap bandage.  Please perform leg elevation as tolerated.

## 2024-01-20 NOTE — Assessment & Plan Note (Signed)
 Likely due to prolonged standing Needs to perform leg elevation as tolerated Needs to remove wrap bandage from left foot at nighttime

## 2024-01-20 NOTE — Assessment & Plan Note (Addendum)
 Overall well-controlled Followed by psychiatry -  [facility] in [place], used to see Dr Evelene Croon On amantadine, Depakote, Lexapro and Remeron

## 2024-06-22 ENCOUNTER — Encounter: Payer: Self-pay | Admitting: Internal Medicine

## 2024-06-22 ENCOUNTER — Ambulatory Visit (INDEPENDENT_AMBULATORY_CARE_PROVIDER_SITE_OTHER): Payer: MEDICAID | Admitting: Internal Medicine

## 2024-06-22 VITALS — BP 154/92 | HR 89 | Ht 72.0 in | Wt 239.4 lb

## 2024-06-22 DIAGNOSIS — F988 Other specified behavioral and emotional disorders with onset usually occurring in childhood and adolescence: Secondary | ICD-10-CM

## 2024-06-22 DIAGNOSIS — F209 Schizophrenia, unspecified: Secondary | ICD-10-CM | POA: Diagnosis not present

## 2024-06-22 DIAGNOSIS — E559 Vitamin D deficiency, unspecified: Secondary | ICD-10-CM

## 2024-06-22 DIAGNOSIS — R030 Elevated blood-pressure reading, without diagnosis of hypertension: Secondary | ICD-10-CM | POA: Diagnosis not present

## 2024-06-22 DIAGNOSIS — Z0001 Encounter for general adult medical examination with abnormal findings: Secondary | ICD-10-CM

## 2024-06-22 DIAGNOSIS — F411 Generalized anxiety disorder: Secondary | ICD-10-CM

## 2024-06-22 DIAGNOSIS — E785 Hyperlipidemia, unspecified: Secondary | ICD-10-CM

## 2024-06-22 DIAGNOSIS — R739 Hyperglycemia, unspecified: Secondary | ICD-10-CM

## 2024-06-22 NOTE — Assessment & Plan Note (Signed)
 Overall well-controlled Followed by psychiatry -  [facility] in [place], used to see Dr Evelene Croon On amantadine, Depakote, Lexapro and Remeron

## 2024-06-22 NOTE — Assessment & Plan Note (Addendum)
 Usually well-controlled with Xanax 1 mg 3 times daily, Lexapro  20 mg daily and Remeron 30 mg nightly Recently has been more stressed due to roof falling at home

## 2024-06-22 NOTE — Assessment & Plan Note (Signed)
 BP Readings from Last 1 Encounters:  06/22/24 (!) 154/92   Could be due to lack of sleep and worsening of anxiety in addition to recent addition of benztropine to his medication list Would prefer to recheck BP after 6 weeks, if persistently elevated, we will initiate antihypertensive medicine Advised DASH diet and moderate exercise/walking, at least 150 mins/week

## 2024-06-22 NOTE — Assessment & Plan Note (Signed)
 Overall well-controlled On Adderall 10 mg 3 times daily

## 2024-06-22 NOTE — Progress Notes (Signed)
 Established Patient Office Visit  Subjective:  Patient ID: George [name], male    DOB: 30-Jan-1991  Age: 33 y.o. MRN: [id number]  CC:  Chief Complaint  Patient presents with   Medical Management of Chronic Issues    5 month f/u , pt reports overthinking.     HPI George [name] is a 33 y.o. male with past medical history of schizophrenia, GAD, ADD and alcohol abuse in remission who presents for f/u of his chronic medical conditions.  His BP was significantly elevated initially during the visit, which improved somewhat later.  He reports being anxious for the last 2 days as his roof at home fell and is managing to fix it.  He also gets anxious around office visits and is not able to sleep at night prior.  Schizophrenia: He is followed by [facility] in [place].  He is on Depakote 250 mg TID, Remeron 30 mg nightly, amantadine 100 mg twice daily and Lexapro  20 mg daily.  He is pleasant and cooperative.  Denies any recent episode of agitation, delusion or hallucinations.  GAD: He takes Xanax 1 mg 3 times daily currently.  Denies any recent manic episode.   ADD: He takes Adderall 10 mg 3 times daily.    Past Medical History:  Diagnosis Date   ADHD (attention deficit hyperactivity disorder)    Depressed    Dislocation of left shoulder joint    Fracture of humeral head, left, closed     Past Surgical History:  Procedure Laterality Date   ORIF HUMERUS FRACTURE Left 06/03/2015   Procedure: OPEN REDUCTION INTERNAL FIXATION LEFT PROXIMAL HUMERUS FRACTURE;  Surgeon: Taft FORBES Minerva, MD;  Location: [facility];  Service: Orthopedics;  Laterality: Left;    History reviewed. No pertinent family history.  Social History   Socioeconomic History   Marital status: Single    Spouse name: Not on file   Number of children: Not on file   Years of education: Not on file   Highest education level: Not on file  Occupational History   Not on file  Tobacco Use   Smoking status:  Some Days    Current packs/day: 1.00    Types: Cigarettes   Smokeless tobacco: Current  Substance and Sexual Activity   Alcohol use: Yes    Alcohol/week: 0.0 standard drinks of alcohol    Comment: occ   Drug use: Not Currently   Sexual activity: Not on file  Other Topics Concern   Not on file  Social History Narrative   Not on file   Social Drivers of Health   Financial Resource Strain: Not on file  Food Insecurity: Not on file  Transportation Needs: Not on file  Physical Activity: Not on file  Stress: Not on file  Social Connections: Not on file  Intimate Partner Violence: Not on file    Outpatient Medications Prior to Visit  Medication Sig Dispense Refill   acetaminophen  (TYLENOL ) 500 MG tablet Take 500 mg by mouth 2 (two) times daily as needed.     ALPRAZolam (XANAX) 1 MG tablet Take 1 mg by mouth 3 (three) times daily.     amantadine (SYMMETREL) 100 MG capsule Take 100 mg by mouth 2 (two) times daily.     amphetamine -dextroamphetamine  (ADDERALL) 10 MG tablet Take 10 mg by mouth 3 (three) times daily.     ARIPiprazole (ABILIFY) 30 MG tablet Take 30 mg by mouth at bedtime.     benztropine (COGENTIN) 0.5 MG tablet  Take 0.5 mg by mouth 2 (two) times daily.     divalproex (DEPAKOTE ER) 250 MG 24 hr tablet Take 250 mg by mouth 3 (three) times daily.     escitalopram  (LEXAPRO ) 20 MG tablet Take 20 mg by mouth daily.     etodolac  (LODINE ) 500 MG tablet Take 1 tablet (500 mg total) by mouth 2 (two) times daily. 60 tablet 3   mirtazapine (REMERON) 30 MG tablet Take 30 mg by mouth at bedtime.     mupirocin  ointment (BACTROBAN ) 2 % Apply 1 Application topically 2 (two) times daily. 22 g 0   naltrexone (DEPADE) 50 MG tablet Take 50 mg by mouth daily.     propranolol (INDERAL) 20 MG tablet Take 20 mg by mouth 3 (three) times daily.     amphetamine -dextroamphetamine  (ADDERALL) 20 MG tablet Take 10 mg by mouth in the morning, at noon, and at bedtime.     No facility-administered  medications prior to visit.    No Known Allergies  ROS Review of Systems  Constitutional:  Negative for chills and fever.  HENT:  Negative for congestion and sore throat.   Eyes:  Negative for pain and discharge.  Respiratory:  Negative for cough and shortness of breath.   Cardiovascular:  Negative for chest pain and palpitations.  Gastrointestinal:  Negative for constipation, diarrhea, nausea and vomiting.  Endocrine: Negative for polydipsia and polyuria.  Genitourinary:  Negative for dysuria and hematuria.  Musculoskeletal:  Negative for neck pain and neck stiffness.  Skin:  Negative for rash.  Neurological:  Negative for dizziness, weakness, numbness and headaches.  Psychiatric/Behavioral:  Negative for agitation and behavioral problems. The patient is nervous/anxious.       Objective:    Physical Exam Vitals reviewed.  Constitutional:      General: He is not in acute distress.    Appearance: He is obese. He is not diaphoretic.  HENT:     Head: Normocephalic and atraumatic.     Nose: Nose normal.     Mouth/Throat:     Mouth: Mucous membranes are moist.  Eyes:     General: No scleral icterus.    Extraocular Movements: Extraocular movements intact.  Cardiovascular:     Rate and Rhythm: Normal rate and regular rhythm.     Heart sounds: Normal heart sounds. No murmur heard. Pulmonary:     Breath sounds: Normal breath sounds. No wheezing or rales.  Musculoskeletal:     Cervical back: Neck supple. No tenderness.     Right lower leg: Edema (Mild) present.     Left lower leg: Edema (Mild) present.  Skin:    General: Skin is warm.     Findings: No rash.  Neurological:     General: No focal deficit present.     Mental Status: He is alert and oriented to person, place, and time.     Sensory: No sensory deficit.     Motor: No weakness.  Psychiatric:        Mood and Affect: Mood normal.        Behavior: Behavior normal.     BP (!) 154/92 (BP Location: Right Arm)    Pulse 89   Ht 6' (1.829 m)   Wt 239 lb 6.4 oz (108.6 kg)   SpO2 98%   BMI 32.47 kg/m  Wt Readings from Last 3 Encounters:  06/22/24 239 lb 6.4 oz (108.6 kg)  01/20/24 246 lb 3.2 oz (111.7 kg)  01/14/24 245 lb (111.1 kg)  No results found for: TSH Lab Results  Component Value Date   WBC 9.5 06/12/2023   HGB 15.8 06/12/2023   HCT 47.1 06/12/2023   MCV 90 06/12/2023   PLT 338 06/12/2023   Lab Results  Component Value Date   NA 134 06/12/2023   K 5.0 06/12/2023   CO2 20 06/12/2023   GLUCOSE 103 (H) 06/12/2023   BUN 7 06/12/2023   CREATININE 1.03 06/12/2023   BILITOT 0.4 06/12/2023   ALKPHOS 94 06/12/2023   AST 22 06/12/2023   ALT 30 06/12/2023   PROT 7.7 06/12/2023   ALBUMIN 4.9 06/12/2023   CALCIUM 10.5 (H) 06/12/2023   ANIONGAP 10 07/23/2018   EGFR 99 06/12/2023   No results found for: CHOL No results found for: HDL No results found for: LDLCALC No results found for: TRIG No results found for: CHOLHDL No results found for: YHAJ8R    Assessment & Plan:   Problem List Items Addressed This Visit       Other   Schizophrenia (HCC)   Overall well-controlled Followed by psychiatry -  [facility] in [place], used to see Dr Vincente On amantadine, Depakote, Lexapro  and Remeron      Relevant Orders   CMP14+EGFR   CBC with Differential/Platelet   Attention deficit disorder   Overall well-controlled On Adderall 10 mg 3 times daily      Relevant Orders   TSH   CMP14+EGFR   CBC with Differential/Platelet   Encounter for general adult medical examination with abnormal findings - Primary   Physical exam as documented. Fasting blood tests ordered today. Denied flu vaccine.      GAD (generalized anxiety disorder)   Usually well-controlled with Xanax 1 mg 3 times daily, Lexapro  20 mg daily and Remeron 30 mg nightly Recently has been more stressed due to roof falling at home      Elevated BP without diagnosis of hypertension   BP Readings from  Last 1 Encounters:  06/22/24 (!) 154/92   Could be due to lack of sleep and worsening of anxiety in addition to recent addition of benztropine to his medication list Would prefer to recheck BP after 6 weeks, if persistently elevated, we will initiate antihypertensive medicine Advised DASH diet and moderate exercise/walking, at least 150 mins/week      Other Visit Diagnoses       Vitamin D deficiency       Relevant Orders   VITAMIN D 25 Hydroxy (Vit-D Deficiency, Fractures)     Hyperlipidemia, unspecified hyperlipidemia type       Relevant Orders   Lipid panel     Hyperglycemia       Relevant Orders   Hemoglobin A1c       No orders of the defined types were placed in this encounter.   Follow-up: Return in about 6 weeks (around 08/03/2024) for HTN.    Suzzane MARLA Blanch, MD

## 2024-06-22 NOTE — Patient Instructions (Signed)
 Please continue to take medications as prescribed.  Please continue to follow low salt diet and perform moderate exercise/walking at least 150 mins/week.  Please get fasting blood tests done before the next visit.

## 2024-06-22 NOTE — Assessment & Plan Note (Signed)
 Physical exam as documented. Fasting blood tests ordered today. Denied flu vaccine.

## 2024-08-05 ENCOUNTER — Encounter: Payer: Self-pay | Admitting: Internal Medicine

## 2024-08-05 ENCOUNTER — Ambulatory Visit (INDEPENDENT_AMBULATORY_CARE_PROVIDER_SITE_OTHER): Payer: MEDICAID | Admitting: Internal Medicine

## 2024-08-05 VITALS — BP 134/86 | HR 76 | Ht 72.0 in | Wt 249.4 lb

## 2024-08-05 DIAGNOSIS — E559 Vitamin D deficiency, unspecified: Secondary | ICD-10-CM

## 2024-08-05 DIAGNOSIS — E782 Mixed hyperlipidemia: Secondary | ICD-10-CM

## 2024-08-05 DIAGNOSIS — F411 Generalized anxiety disorder: Secondary | ICD-10-CM | POA: Diagnosis not present

## 2024-08-05 DIAGNOSIS — F209 Schizophrenia, unspecified: Secondary | ICD-10-CM

## 2024-08-05 DIAGNOSIS — R030 Elevated blood-pressure reading, without diagnosis of hypertension: Secondary | ICD-10-CM | POA: Diagnosis not present

## 2024-08-05 NOTE — Patient Instructions (Signed)
Please continue to take medications as prescribed.  Please continue to follow low salt diet and perform moderate exercise/walking at least 150 mins/week. 

## 2024-08-05 NOTE — Assessment & Plan Note (Signed)
 BP Readings from Last 1 Encounters:  08/05/24 134/86   BP improved today compared to previous visit Likely has component of whitecoat hypertension as well Would avoid adding antihypertensive for now Advised DASH diet and moderate exercise/walking, at least 150 mins/week

## 2024-08-05 NOTE — Progress Notes (Signed)
 Established Patient Office Visit  Subjective:  Patient ID: George [name], male    DOB: [birth year]  Age: 33 y.o. MRN: [id number]  CC:  Chief Complaint  Patient presents with   Hypertension    6 week f/u     HPI George [name] is a 33 y.o. male with past medical history of schizophrenia, GAD, ADD and alcohol abuse in remission who presents for f/u of his chronic medical conditions.  His BP was WNL today.  Denies any headache, dizziness, chest pain or dyspnea.  He also gets anxious around office visits and is not able to sleep at night prior.  Schizophrenia: He is followed by [facility] in [place].  He is on Depakote 250 mg TID, Remeron 30 mg nightly, amantadine 100 mg twice daily and Lexapro  20 mg daily.  He is pleasant and cooperative.  Denies any recent episode of agitation, delusion or hallucinations.  GAD: He takes Xanax 1 mg 3 times daily currently.  Denies any recent manic episode.   ADD: He takes Adderall 10 mg 3 times daily.    Past Medical History:  Diagnosis Date   ADHD (attention deficit hyperactivity disorder)    Depressed    Dislocation of left shoulder joint    Fracture of humeral head, left, closed     Past Surgical History:  Procedure Laterality Date   ORIF HUMERUS FRACTURE Left 06/03/2015   Procedure: OPEN REDUCTION INTERNAL FIXATION LEFT PROXIMAL HUMERUS FRACTURE;  Surgeon: Taft FORBES Minerva, MD;  Location: [facility];  Service: Orthopedics;  Laterality: Left;    History reviewed. No pertinent family history.  Social History   Socioeconomic History   Marital status: Single    Spouse name: Not on file   Number of children: Not on file   Years of education: Not on file   Highest education level: Not on file  Occupational History   Not on file  Tobacco Use   Smoking status: Some Days    Current packs/day: 1.00    Types: Cigarettes   Smokeless tobacco: Current  Substance and Sexual Activity   Alcohol use: Yes    Alcohol/week: 0.0  standard drinks of alcohol    Comment: occ   Drug use: Not Currently   Sexual activity: Not on file  Other Topics Concern   Not on file  Social History Narrative   Not on file   Social Drivers of Health   Financial Resource Strain: Not on file  Food Insecurity: Not on file  Transportation Needs: Not on file  Physical Activity: Not on file  Stress: Not on file  Social Connections: Not on file  Intimate Partner Violence: Not on file    Outpatient Medications Prior to Visit  Medication Sig Dispense Refill   acetaminophen  (TYLENOL ) 500 MG tablet Take 500 mg by mouth 2 (two) times daily as needed.     ALPRAZolam (XANAX) 1 MG tablet Take 1 mg by mouth 3 (three) times daily.     amantadine (SYMMETREL) 100 MG capsule Take 100 mg by mouth 2 (two) times daily.     amphetamine -dextroamphetamine  (ADDERALL) 10 MG tablet Take 10 mg by mouth 3 (three) times daily.     ARIPiprazole (ABILIFY) 30 MG tablet Take 30 mg by mouth at bedtime.     benztropine (COGENTIN) 0.5 MG tablet Take 0.5 mg by mouth 2 (two) times daily.     divalproex (DEPAKOTE ER) 250 MG 24 hr tablet Take 250 mg by mouth 3 (three) times  daily.     escitalopram  (LEXAPRO ) 20 MG tablet Take 20 mg by mouth daily.     etodolac  (LODINE ) 500 MG tablet Take 1 tablet (500 mg total) by mouth 2 (two) times daily. 60 tablet 3   mirtazapine (REMERON) 30 MG tablet Take 30 mg by mouth at bedtime.     mupirocin  ointment (BACTROBAN ) 2 % Apply 1 Application topically 2 (two) times daily. 22 g 0   naltrexone (DEPADE) 50 MG tablet Take 50 mg by mouth daily.     propranolol (INDERAL) 20 MG tablet Take 20 mg by mouth 3 (three) times daily.     No facility-administered medications prior to visit.    No Known Allergies  ROS Review of Systems  Constitutional:  Negative for chills and fever.  HENT:  Negative for congestion and sore throat.   Eyes:  Negative for pain and discharge.  Respiratory:  Negative for cough and shortness of breath.    Cardiovascular:  Negative for chest pain and palpitations.  Gastrointestinal:  Negative for diarrhea, nausea and vomiting.  Endocrine: Negative for polydipsia and polyuria.  Genitourinary:  Negative for dysuria and hematuria.  Musculoskeletal:  Negative for neck pain and neck stiffness.  Skin:  Negative for rash.  Neurological:  Negative for dizziness, weakness, numbness and headaches.  Psychiatric/Behavioral:  Negative for agitation and behavioral problems. The patient is nervous/anxious.       Objective:    Physical Exam Vitals reviewed.  Constitutional:      General: He is not in acute distress.    Appearance: He is obese. He is not diaphoretic.  HENT:     Head: Normocephalic and atraumatic.     Nose: Nose normal.     Mouth/Throat:     Mouth: Mucous membranes are moist.  Eyes:     General: No scleral icterus.    Extraocular Movements: Extraocular movements intact.  Cardiovascular:     Rate and Rhythm: Normal rate and regular rhythm.     Heart sounds: Normal heart sounds. No murmur heard. Pulmonary:     Breath sounds: Normal breath sounds. No wheezing or rales.  Musculoskeletal:     Cervical back: Neck supple. No tenderness.     Right lower leg: No edema.     Left lower leg: No edema.  Skin:    General: Skin is warm.     Findings: No rash.  Neurological:     General: No focal deficit present.     Mental Status: He is alert and oriented to person, place, and time.     Sensory: No sensory deficit.     Motor: No weakness.  Psychiatric:        Mood and Affect: Mood normal.        Behavior: Behavior normal.     BP 134/86 (BP Location: Left Arm)   Pulse 76   Ht 6' (1.829 m)   Wt 249 lb 6.4 oz (113.1 kg)   SpO2 97%   BMI 33.82 kg/m  Wt Readings from Last 3 Encounters:  08/05/24 249 lb 6.4 oz (113.1 kg)  06/22/24 239 lb 6.4 oz (108.6 kg)  01/20/24 246 lb 3.2 oz (111.7 kg)    No results found for: TSH Lab Results  Component Value Date   WBC 9.5 06/12/2023    HGB 15.8 06/12/2023   HCT 47.1 06/12/2023   MCV 90 06/12/2023   PLT 338 06/12/2023   Lab Results  Component Value Date   NA 134 06/12/2023  K 5.0 06/12/2023   CO2 20 06/12/2023   GLUCOSE 103 (H) 06/12/2023   BUN 7 06/12/2023   CREATININE 1.03 06/12/2023   BILITOT 0.4 06/12/2023   ALKPHOS 94 06/12/2023   AST 22 06/12/2023   ALT 30 06/12/2023   PROT 7.7 06/12/2023   ALBUMIN 4.9 06/12/2023   CALCIUM 10.5 (H) 06/12/2023   ANIONGAP 10 07/23/2018   EGFR 99 06/12/2023   No results found for: CHOL No results found for: HDL No results found for: LDLCALC No results found for: TRIG No results found for: CHOLHDL No results found for: YHAJ8R    Assessment & Plan:   Problem List Items Addressed This Visit       Other   Schizophrenia (HCC)   Overall well-controlled Followed by psychiatry -  [facility] in [place], used to see Dr Vincente On amantadine, Depakote, Lexapro  and Remeron      GAD (generalized anxiety disorder)   Usually well-controlled with Xanax 1 mg 3 times daily, Lexapro  20 mg daily and Remeron 30 mg nightly      Prehypertension - Primary   BP Readings from Last 1 Encounters:  08/05/24 134/86   BP improved today compared to previous visit Likely has component of whitecoat hypertension as well Would avoid adding antihypertensive for now Advised DASH diet and moderate exercise/walking, at least 150 mins/week        No orders of the defined types were placed in this encounter.   Follow-up: Return in about 6 months (around 02/03/2025).    Suzzane MARLA Blanch, MD

## 2024-08-05 NOTE — Assessment & Plan Note (Signed)
 Overall well-controlled Followed by psychiatry -  [facility] in [place], used to see Dr Evelene Croon On amantadine, Depakote, Lexapro and Remeron

## 2024-08-05 NOTE — Assessment & Plan Note (Addendum)
 Usually well-controlled with Xanax 1 mg 3 times daily, Lexapro  20 mg daily and Remeron 30 mg nightly

## 2024-08-06 ENCOUNTER — Ambulatory Visit: Payer: Self-pay | Admitting: Internal Medicine

## 2024-08-06 DIAGNOSIS — E559 Vitamin D deficiency, unspecified: Secondary | ICD-10-CM | POA: Insufficient documentation

## 2024-08-06 DIAGNOSIS — E782 Mixed hyperlipidemia: Secondary | ICD-10-CM | POA: Insufficient documentation

## 2024-08-06 LAB — VITAMIN D 25 HYDROXY (VIT D DEFICIENCY, FRACTURES): Vit D, 25-Hydroxy: 10.8 ng/mL — ABNORMAL LOW (ref 30.0–100.0)

## 2024-08-06 LAB — CMP14+EGFR
ALT: 31 IU/L (ref 0–44)
AST: 19 IU/L (ref 0–40)
Albumin: 4.6 g/dL (ref 4.1–5.1)
Alkaline Phosphatase: 72 IU/L (ref 47–123)
BUN/Creatinine Ratio: 10 (ref 9–20)
BUN: 10 mg/dL (ref 6–20)
Bilirubin Total: 0.3 mg/dL (ref 0.0–1.2)
CO2: 22 mmol/L (ref 20–29)
Calcium: 9.7 mg/dL (ref 8.7–10.2)
Chloride: 96 mmol/L (ref 96–106)
Creatinine, Ser: 0.96 mg/dL (ref 0.76–1.27)
Globulin, Total: 2.7 g/dL (ref 1.5–4.5)
Glucose: 93 mg/dL (ref 70–99)
Potassium: 5.4 mmol/L — ABNORMAL HIGH (ref 3.5–5.2)
Sodium: 136 mmol/L (ref 134–144)
Total Protein: 7.3 g/dL (ref 6.0–8.5)
eGFR: 107 mL/min/1.73 (ref >59)

## 2024-08-06 LAB — CBC WITH DIFFERENTIAL/PLATELET
Basophils Absolute: 0.1 x10E3/uL (ref 0.0–0.2)
Basos: 1 %
EOS (ABSOLUTE): 0.1 x10E3/uL (ref 0.0–0.4)
Eos: 1 %
Hematocrit: 47.5 % (ref 37.5–51.0)
Hemoglobin: 15.9 g/dL (ref 13.0–17.7)
Immature Grans (Abs): 0.1 x10E3/uL (ref 0.0–0.1)
Immature Granulocytes: 1 %
Lymphocytes Absolute: 4.5 x10E3/uL — ABNORMAL HIGH (ref 0.7–3.1)
Lymphs: 35 %
MCH: 32.3 pg (ref 26.6–33.0)
MCHC: 33.5 g/dL (ref 31.5–35.7)
MCV: 96 fL (ref 79–97)
Monocytes Absolute: 0.8 x10E3/uL (ref 0.1–0.9)
Monocytes: 6 %
Neutrophils Absolute: 7.3 x10E3/uL — ABNORMAL HIGH (ref 1.4–7.0)
Neutrophils: 56 %
Platelets: 369 x10E3/uL (ref 150–450)
RBC: 4.93 x10E6/uL (ref 4.14–5.80)
RDW: 13 % (ref 11.6–15.4)
WBC: 12.7 x10E3/uL — ABNORMAL HIGH (ref 3.4–10.8)

## 2024-08-06 LAB — TSH: TSH: 1.56 u[IU]/mL (ref 0.450–4.500)

## 2024-08-06 LAB — LIPID PANEL
Chol/HDL Ratio: 6.2 ratio — ABNORMAL HIGH (ref 0.0–5.0)
Cholesterol, Total: 259 mg/dL — ABNORMAL HIGH (ref 100–199)
HDL: 42 mg/dL (ref >39)
LDL Chol Calc (NIH): 173 mg/dL — ABNORMAL HIGH (ref 0–99)
Triglycerides: 232 mg/dL — ABNORMAL HIGH (ref 0–149)
VLDL Cholesterol Cal: 44 mg/dL — ABNORMAL HIGH (ref 5–40)

## 2024-08-06 LAB — HEMOGLOBIN A1C
Est. average glucose Bld gHb Est-mCnc: 120 mg/dL
Hgb A1c MFr Bld: 5.8 % — ABNORMAL HIGH (ref 4.8–5.6)

## 2024-08-06 MED ORDER — VITAMIN D (ERGOCALCIFEROL) 1.25 MG (50000 UNIT) PO CAPS: 50000.0000 [IU] | ORAL_CAPSULE | ORAL | 1 refills | Status: AC

## 2024-08-06 MED ORDER — ROSUVASTATIN CALCIUM 10 MG PO TABS: 10.0000 mg | ORAL_TABLET | Freq: Every day | ORAL | 3 refills | Status: AC

## 2024-08-06 NOTE — Addendum Note (Signed)
 Addended byBETHA TOBIE DOWNS on: 08/06/2024 08:02 AM   Modules accepted: Orders

## 2024-08-06 NOTE — Assessment & Plan Note (Signed)
Lipid profile reviewed Started Crestor 10 mg QD

## 2024-08-06 NOTE — Addendum Note (Signed)
 Addended byBETHA TOBIE DOWNS on: 08/06/2024 12:50 PM   Modules accepted: Orders, Level of Service

## 2024-08-06 NOTE — Assessment & Plan Note (Signed)
 Started vitamin D 50,000 IU QW

## 2025-02-03 ENCOUNTER — Ambulatory Visit: Payer: MEDICAID | Admitting: Internal Medicine
